# Patient Record
Sex: Male | Born: 1960 | Hispanic: Yes | Marital: Single | Smoking: Current some day smoker
Health system: Southern US, Community
[De-identification: ages and names within clinical notes are randomized; demographics above are authoritative.]

## PROBLEM LIST (undated history)

## (undated) DIAGNOSIS — Z789 Other specified health status: Secondary | ICD-10-CM

## (undated) HISTORY — PX: NO PAST SURGERIES: SHX2092

---

## 2008-11-09 ENCOUNTER — Inpatient Hospital Stay: Payer: Self-pay | Admitting: Internal Medicine

## 2012-11-13 ENCOUNTER — Inpatient Hospital Stay: Payer: Self-pay | Admitting: Internal Medicine

## 2012-11-13 LAB — COMPREHENSIVE METABOLIC PANEL
Alkaline Phosphatase: 82 U/L (ref 50–136)
Anion Gap: 4 — ABNORMAL LOW (ref 7–16)
BUN: 14 mg/dL (ref 7–18)
Bilirubin,Total: 0.3 mg/dL (ref 0.2–1.0)
Chloride: 107 mmol/L (ref 98–107)
Creatinine: 0.8 mg/dL (ref 0.60–1.30)
EGFR (Non-African Amer.): >60
SGOT(AST): 103 U/L — ABNORMAL HIGH (ref 15–37)
SGPT (ALT): 77 U/L (ref 12–78)
Sodium: 140 mmol/L (ref 136–145)

## 2012-11-13 LAB — TROPONIN I: Troponin-I: <0.02 ng/mL

## 2012-11-13 LAB — CBC
HCT: 42.2 % (ref 40.0–52.0)
HGB: 14.3 g/dL (ref 13.0–18.0)
MCH: 29.8 pg (ref 26.0–34.0)
MCHC: 33.8 g/dL (ref 32.0–36.0)
MCV: 88 fL (ref 80–100)
RBC: 4.78 10*6/uL (ref 4.40–5.90)
RDW: 13.5 % (ref 11.5–14.5)
WBC: 14.5 10*3/uL — ABNORMAL HIGH (ref 3.8–10.6)

## 2012-11-14 LAB — CBC WITH DIFFERENTIAL/PLATELET
Basophil %: 0.3 %
Eosinophil #: 0.1 10*3/uL (ref 0.0–0.7)
HGB: 14.2 g/dL (ref 13.0–18.0)
Lymphocyte #: 2 10*3/uL (ref 1.0–3.6)
Lymphocyte %: 21.7 %
MCH: 30.6 pg (ref 26.0–34.0)
MCHC: 34.9 g/dL (ref 32.0–36.0)
Monocyte #: 0.6 x10 3/mm (ref 0.2–1.0)
Monocyte %: 6.5 %
Neutrophil #: 6.4 10*3/uL (ref 1.4–6.5)
Platelet: 209 10*3/uL (ref 150–440)
RBC: 4.64 10*6/uL (ref 4.40–5.90)

## 2012-11-14 LAB — COMPREHENSIVE METABOLIC PANEL
Alkaline Phosphatase: 85 U/L (ref 50–136)
Anion Gap: 6 — ABNORMAL LOW (ref 7–16)
Bilirubin,Total: 0.7 mg/dL (ref 0.2–1.0)
Co2: 25 mmol/L (ref 21–32)
Creatinine: 0.61 mg/dL (ref 0.60–1.30)
EGFR (African American): >60
EGFR (Non-African Amer.): >60
Glucose: 92 mg/dL (ref 65–99)
Osmolality: 277 (ref 275–301)
Potassium: 3.9 mmol/L (ref 3.5–5.1)
SGOT(AST): 40 U/L — ABNORMAL HIGH (ref 15–37)
SGPT (ALT): 69 U/L (ref 12–78)
Sodium: 139 mmol/L (ref 136–145)
Total Protein: 6.7 g/dL (ref 6.4–8.2)

## 2013-06-16 ENCOUNTER — Inpatient Hospital Stay (HOSPITAL_COMMUNITY)
Admission: EM | Admit: 2013-06-16 | Discharge: 2013-06-21 | DRG: 417 | Disposition: A | Discharge status: Home / Self Care | Payer: Self-pay | Attending: Internal Medicine | Admitting: Internal Medicine

## 2013-06-16 ENCOUNTER — Emergency Department (HOSPITAL_COMMUNITY): Payer: Self-pay

## 2013-06-16 ENCOUNTER — Encounter (HOSPITAL_COMMUNITY): Payer: Self-pay | Admitting: Emergency Medicine

## 2013-06-16 DIAGNOSIS — IMO0001 Reserved for inherently not codable concepts without codable children: Secondary | ICD-10-CM | POA: Clinically undetermined

## 2013-06-16 DIAGNOSIS — K859 Acute pancreatitis without necrosis or infection, unspecified: Secondary | ICD-10-CM

## 2013-06-16 DIAGNOSIS — R1013 Epigastric pain: Secondary | ICD-10-CM

## 2013-06-16 DIAGNOSIS — Z9049 Acquired absence of other specified parts of digestive tract: Secondary | ICD-10-CM

## 2013-06-16 DIAGNOSIS — I10 Essential (primary) hypertension: Secondary | ICD-10-CM | POA: Diagnosis present

## 2013-06-16 DIAGNOSIS — K851 Biliary acute pancreatitis without necrosis or infection: Secondary | ICD-10-CM | POA: Diagnosis present

## 2013-06-16 DIAGNOSIS — R112 Nausea with vomiting, unspecified: Secondary | ICD-10-CM | POA: Diagnosis present

## 2013-06-16 DIAGNOSIS — R609 Edema, unspecified: Secondary | ICD-10-CM | POA: Diagnosis present

## 2013-06-16 DIAGNOSIS — K658 Other peritonitis: Secondary | ICD-10-CM | POA: Diagnosis present

## 2013-06-16 DIAGNOSIS — R7989 Other specified abnormal findings of blood chemistry: Secondary | ICD-10-CM | POA: Diagnosis present

## 2013-06-16 DIAGNOSIS — K8020 Calculus of gallbladder without cholecystitis without obstruction: Secondary | ICD-10-CM | POA: Diagnosis present

## 2013-06-16 DIAGNOSIS — K8070 Calculus of gallbladder and bile duct without cholecystitis without obstruction: Secondary | ICD-10-CM | POA: Diagnosis present

## 2013-06-16 HISTORY — DX: Other specified health status: Z78.9

## 2013-06-16 LAB — LIPASE, BLOOD: Lipase: >3000 U/L — ABNORMAL HIGH (ref 11–59)

## 2013-06-16 LAB — COMPREHENSIVE METABOLIC PANEL
ALT: 55 U/L — ABNORMAL HIGH (ref 0–53)
Alkaline Phosphatase: 95 U/L (ref 39–117)
BUN: 17 mg/dL (ref 6–23)
Calcium: 9.9 mg/dL (ref 8.4–10.5)
Chloride: 102 mEq/L (ref 96–112)
GFR calc Af Amer: >90 mL/min (ref >90)
Glucose, Bld: 109 mg/dL — ABNORMAL HIGH (ref 70–99)
Potassium: 4 mEq/L (ref 3.5–5.1)
Sodium: 141 mEq/L (ref 135–145)
Total Bilirubin: 0.5 mg/dL (ref 0.3–1.2)
Total Protein: 8.6 g/dL — ABNORMAL HIGH (ref 6.0–8.3)

## 2013-06-16 LAB — CBC
HCT: 45.4 % (ref 39.0–52.0)
Hemoglobin: 16 g/dL (ref 13.0–17.0)
MCH: 30.8 pg (ref 26.0–34.0)
MCHC: 35.2 g/dL (ref 30.0–36.0)
RBC: 5.2 MIL/uL (ref 4.22–5.81)
WBC: 16.6 10*3/uL — ABNORMAL HIGH (ref 4.0–10.5)

## 2013-06-16 MED ORDER — PROMETHAZINE HCL 25 MG/ML IJ SOLN
12.5000 mg | Freq: Once | INTRAMUSCULAR | Status: AC
  Administered 2013-06-16: 12.5 mg via INTRAVENOUS
  Filled 2013-06-16: qty 1

## 2013-06-16 MED ORDER — ONDANSETRON HCL 4 MG/2ML IJ SOLN
4.0000 mg | Freq: Three times a day (TID) | INTRAMUSCULAR | Status: DC | PRN
  Administered 2013-06-16: 4 mg via INTRAVENOUS
  Filled 2013-06-16: qty 2

## 2013-06-16 MED ORDER — SODIUM CHLORIDE 0.9 % IV BOLUS (SEPSIS)
1000.0000 mL | Freq: Once | INTRAVENOUS | Status: AC
  Administered 2013-06-16: 1000 mL via INTRAVENOUS

## 2013-06-16 MED ORDER — ENOXAPARIN SODIUM 40 MG/0.4ML ~~LOC~~ SOLN
40.0000 mg | SUBCUTANEOUS | Status: DC
  Administered 2013-06-16 – 2013-06-18 (×3): 40 mg via SUBCUTANEOUS
  Filled 2013-06-16 (×5): qty 0.4

## 2013-06-16 MED ORDER — SODIUM CHLORIDE 0.9 % IJ SOLN
3.0000 mL | Freq: Two times a day (BID) | INTRAMUSCULAR | Status: DC
  Administered 2013-06-16 – 2013-06-19 (×4): 3 mL via INTRAVENOUS

## 2013-06-16 MED ORDER — HYDROMORPHONE HCL PF 1 MG/ML IJ SOLN
1.0000 mg | Freq: Once | INTRAMUSCULAR | Status: AC
  Administered 2013-06-16: 1 mg via INTRAVENOUS
  Filled 2013-06-16: qty 1

## 2013-06-16 MED ORDER — ONDANSETRON HCL 4 MG PO TABS: 4.0000 mg | ORAL_TABLET | Freq: Four times a day (QID) | ORAL | Status: DC | PRN

## 2013-06-16 MED ORDER — SODIUM CHLORIDE 0.9 % IV SOLN
INTRAVENOUS | Status: AC
  Administered 2013-06-16: 21:00:00 via INTRAVENOUS

## 2013-06-16 MED ORDER — HYDROMORPHONE HCL PF 1 MG/ML IJ SOLN
1.0000 mg | INTRAMUSCULAR | Status: DC | PRN
  Administered 2013-06-16 – 2013-06-19 (×3): 1 mg via INTRAVENOUS
  Filled 2013-06-16 (×3): qty 1

## 2013-06-16 MED ORDER — PROMETHAZINE HCL 25 MG/ML IJ SOLN
12.5000 mg | Freq: Once | INTRAMUSCULAR | Status: AC
  Administered 2013-06-16: 12.5 mg via INTRAVENOUS
  Filled 2013-06-16 (×2): qty 1

## 2013-06-16 MED ORDER — HYDROMORPHONE HCL PF 1 MG/ML IJ SOLN: 1.0000 mg | INTRAMUSCULAR | Status: DC | PRN

## 2013-06-16 MED ORDER — ONDANSETRON HCL 4 MG/2ML IJ SOLN
4.0000 mg | Freq: Four times a day (QID) | INTRAMUSCULAR | Status: DC | PRN
  Administered 2013-06-17: 4 mg via INTRAVENOUS
  Filled 2013-06-16: qty 2

## 2013-06-16 MED ORDER — SODIUM CHLORIDE 0.9 % IV SOLN
INTRAVENOUS | Status: AC
  Administered 2013-06-16 – 2013-06-17 (×2): via INTRAVENOUS

## 2013-06-16 NOTE — ED Provider Notes (Signed)
CSN: [id number]     Arrival date & time 06/16/13  1530 History   First MD Initiated Contact with Patient 06/16/13 1540     Chief Complaint  Patient presents with  . Back Pain  . Abdominal Pain   (Consider location/radiation/quality/duration/timing/severity/associated sxs/prior Treatment) HPI Comments: The patient is a 52 year-old male with a past medical history of pancreatitis, presenting the Emergency Department with a chief complaint of epigastric pain for 1 hour. The patient reports 10/10 discomfort described as cramping and radiating to his upper back.  He reports an abrupt onset of abdominal pain while at work.  He denies aggravating factors or relieving factors.  Denies EtOH use. Denies history of abdominal surgeries.     The history is provided by the patient. A language interpreter was used.    History reviewed. No pertinent past medical history. History reviewed. No pertinent past surgical history. History reviewed. No pertinent family history. History  Substance Use Topics  . Smoking status: Never Smoker   . Smokeless tobacco: Not on file  . Alcohol Use: No    Review of Systems  Constitutional: Negative for fever and chills.  Respiratory: Negative for cough and shortness of breath.   Cardiovascular: Negative for chest pain and leg swelling.  Gastrointestinal: Positive for abdominal pain. Negative for nausea, vomiting, diarrhea, constipation and blood in stool.  Genitourinary: Negative for dysuria, hematuria and flank pain.  Musculoskeletal: Positive for back pain.  All other systems reviewed and are negative.    Allergies  Review of patient's allergies indicates no known allergies.  Home Medications  No current outpatient prescriptions on file. BP 189/107  Pulse 67  Temp(Src) 97.8 F (36.6 C) (Oral)  Resp 20  SpO2 97% Physical Exam  Nursing note and vitals reviewed. Constitutional: He is oriented to person, place, and time. He appears well-developed and  well-nourished.  Appears uncomfortable  HENT:  Head: Normocephalic and atraumatic.  Eyes: EOM are normal. Pupils are equal, round, and reactive to light. No scleral icterus.  Neck: Neck supple.  Cardiovascular: Normal rate, regular rhythm and normal heart sounds.   No murmur heard. Pulmonary/Chest: Effort normal and breath sounds normal. He has no wheezes.  Abdominal: Soft. Normal appearance and bowel sounds are normal. There is tenderness in the right upper quadrant, epigastric area and left upper quadrant. There is no rebound, no guarding, no CVA tenderness, no tenderness at McBurney's point and negative Murphy's sign.  Musculoskeletal: Normal range of motion. He exhibits no edema.  Neurological: He is alert and oriented to person, place, and time.  Skin: Skin is warm and dry. No rash noted.  Psychiatric: He has a normal mood and affect.    ED Course  Procedures (including critical care time) Labs Review Labs Reviewed  CBC - Abnormal; Notable for the following:    WBC 16.6 (*)    All other components within normal limits  COMPREHENSIVE METABOLIC PANEL - Abnormal; Notable for the following:    Glucose, Bld 109 (*)    Total Protein 8.6 (*)    AST 85 (*)    ALT 55 (*)    All other components within normal limits  LIPASE, BLOOD - Abnormal; Notable for the following:    Lipase >3000 (*)    All other components within normal limits   Imaging Review US Abdomen Complete  06/16/2013   CLINICAL DATA:  Abdomen pain  EXAM: ULTRASOUND ABDOMEN COMPLETE  COMPARISON:  None.  FINDINGS: Gallbladder:  There are sludge and stones within  the gallbladder. No sonographic Murphy sign noted.  Common bile duct:  Diameter: 5.7 mm  Liver:  There is diffuse increased echotexture the visualized right lobe liver. The left lobe liver is not visualized due to overlying bowel gas and patient body habitus.  IVC:  Not well seen due to overlying bowel gas.  Pancreas:  Not well seen due to overlying bowel gas.   Spleen:  Size and appearance within normal limits.  Right Kidney:  Length: 11.8 cm. Echogenicity within normal limits. No mass or hydronephrosis visualized.  Left Kidney:  Length: 11.7 cm. Echogenicity within normal limits. No mass or hydronephrosis visualized.  Abdominal aorta:  No aneurysm visualized in the proximal and mid aorta. The distal aorta is not well seen due to overlying bowel gas.  Other findings:  None.  IMPRESSION: Diffuse increased echotexture of the liver with low echogenicity around the gallbladder fossa probably due to fatty infiltration of liver with focal fatty sparing. Midline structures as described are not well seen due to overlying bowel gas. Cholelithiasis without sonographic evidence of acute cholecystitis.   Electronically Signed   By: Sherian Rein M.D.   On: 06/16/2013 18:26   Dg Abd Acute W/chest  06/16/2013   CLINICAL DATA:  Epigastric pain with nausea and vomiting.  EXAM: ACUTE ABDOMEN SERIES (ABDOMEN 2 VIEW & CHEST 1 VIEW)  COMPARISON:  None.  FINDINGS: Frontal view of the chest demonstrates midline trachea. Heart size upper normal, accentuated by low lung volumes. No pleural effusion or pneumothorax. Bibasilar volume loss with patchy airspace disease.  Abdominal films demonstrate no free intraperitoneal air or significant air-fluid levels on upright positioning. Minimal motion degradation on upright view.  Supine view demonstrates mild paucity of upper abdominal bowel gas. Distal gas and stool. No abnormal abdominal calcifications. No appendicolith. Minimal convex right lumbar spine curvature.  IMPRESSION: Nonspecific bowel gas pattern, with paucity of upper abdominal small bowel gas. No obstruction or free intraperitoneal air identified.  Bibasilar volume loss with patchy atelectasis.   Electronically Signed   By: Jeronimo Greaves M.D.   On: 06/16/2013 17:20    EKG Interpretation   None       MDM   1. Pancreatitis    Patient with a history of pancreatitis presents with  an abrupt onset of epigastric pain.  IV fluids, pain medication, labs ordered. Discussed patient history, condition, and current evaluation with Dr. Blinda Leatherwood who advises [place] and acute abdominal series to rule out perforation. Imaging ordered. 1715 re-eval: patient still in pain and has not received his pain medication yet.  He complains of nausea. Medication ordered. Lipase >3000 Re-eval patient reports pain has improved and nausea has resolved.  Discussed pancreatitis with the patient and consult call with the hospitalist.  1915: Discussed patient history, condition and results with Dr. Tarry Kos who will admit the patient for pancreatitis.  Advises to consult general surgery. 1948: Discussed patient history, condition and results with Dr. Lodema Pilot, General Surgery.  He reports he will evaluate the patient.  Meds given in ED:  Medications  sodium chloride 0.9 % bolus 1,000 mL (not administered)  HYDROmorphone (DILAUDID) injection 1 mg (1 mg Intravenous Given 06/16/13 1646)  sodium chloride 0.9 % bolus 1,000 mL (1,000 mLs Intravenous New Bag/Given 06/16/13 1646)  promethazine (PHENERGAN) injection 12.5 mg (12.5 mg Intravenous Given 06/16/13 1809)  HYDROmorphone (DILAUDID) injection 1 mg (1 mg Intravenous Given 06/16/13 1835)  promethazine (PHENERGAN) injection 12.5 mg (12.5 mg Intravenous Given 06/16/13 1902)    New  Prescriptions   No medications on file      Clabe Seal, PA-C 06/17/13 0110

## 2013-06-16 NOTE — Consult Note (Signed)
Reason for Consult:pancreatitis Referring Physician: Mellody Drown, PA  Brandon Hood is an 52 y.o. male.  HPI: we were asked to evaluate this patient for gallstone pancreatitis. He had the acute onset of pain earlier today which was located in his epigastrium. He denied any lower abdominal pain and states that the pain is rated at 8/10. He has a history of prior admission for pancreatitis about 6 months ago which he says was the same type of pain that he feels currently.  He has not had any nausea vomiting or fevers or chills. He says that he has used the bathroom routinely without any blood in the stools. He denies any postprandial pain previously and besides these 2 episodes. He denies any recent alcohol use.  He has been a drinker in the past but has been several years.  Past Medical History  Diagnosis Date  . Medical history non-contributory     Past Surgical History  Procedure Laterality Date  . No past surgeries      History reviewed. No pertinent family history.  Social History:  reports that he has never smoked. He has never used smokeless tobacco. He reports that he does not drink alcohol or use illicit drugs.  Allergies: No Known Allergies  Medications: I have reviewed the patient's current medications.  Results for orders placed during the hospital encounter of 06/16/13 (from the past 48 hour(s))  CBC     Status: Abnormal   Collection Time    06/16/13  4:40 PM      Result Value Range   WBC 16.6 (*) 4.0 - 10.5 K/uL   RBC 5.20  4.22 - 5.81 MIL/uL   Hemoglobin 16.0  13.0 - 17.0 g/dL   HCT 82.9  56.2 - 13.0 %   MCV 87.3  78.0 - 100.0 fL   MCH 30.8  26.0 - 34.0 pg   MCHC 35.2  30.0 - 36.0 g/dL   RDW 86.5  78.4 - 69.6 %   Platelets 281  150 - 400 K/uL  COMPREHENSIVE METABOLIC PANEL     Status: Abnormal   Collection Time    06/16/13  4:40 PM      Result Value Range   Sodium 141  135 - 145 mEq/L   Potassium 4.0  3.5 - 5.1 mEq/L   Chloride 102  96 - 112 mEq/L    CO2 27  19 - 32 mEq/L   Glucose, Bld 109 (*) 70 - 99 mg/dL   BUN 17  6 - 23 mg/dL   Creatinine, Ser 2.95  0.50 - 1.35 mg/dL   Calcium 9.9  8.4 - 28.4 mg/dL   Total Protein 8.6 (*) 6.0 - 8.3 g/dL   Albumin 4.6  3.5 - 5.2 g/dL   AST 85 (*) 0 - 37 U/L   ALT 55 (*) 0 - 53 U/L   Alkaline Phosphatase 95  39 - 117 U/L   Total Bilirubin 0.5  0.3 - 1.2 mg/dL   GFR calc non Af Amer >90  >90 mL/min   GFR calc Af Amer >90  >90 mL/min   Comment: (NOTE)     The eGFR has been calculated using the CKD EPI equation.     This calculation has not been validated in all clinical situations.     eGFR's persistently <90 mL/min signify possible Chronic Kidney     Disease.  LIPASE, BLOOD     Status: Abnormal   Collection Time    06/16/13  4:40 PM  Result Value Range   Lipase >3000 (*) 11 - 59 U/L    US Abdomen Complete  06/16/2013   CLINICAL DATA:  Abdomen pain  EXAM: ULTRASOUND ABDOMEN COMPLETE  COMPARISON:  None.  FINDINGS: Gallbladder:  There are sludge and stones within the gallbladder. No sonographic Murphy sign noted.  Common bile duct:  Diameter: 5.7 mm  Liver:  There is diffuse increased echotexture the visualized right lobe liver. The left lobe liver is not visualized due to overlying bowel gas and patient body habitus.  IVC:  Not well seen due to overlying bowel gas.  Pancreas:  Not well seen due to overlying bowel gas.  Spleen:  Size and appearance within normal limits.  Right Kidney:  Length: 11.8 cm. Echogenicity within normal limits. No mass or hydronephrosis visualized.  Left Kidney:  Length: 11.7 cm. Echogenicity within normal limits. No mass or hydronephrosis visualized.  Abdominal aorta:  No aneurysm visualized in the proximal and mid aorta. The distal aorta is not well seen due to overlying bowel gas.  Other findings:  None.  IMPRESSION: Diffuse increased echotexture of the liver with low echogenicity around the gallbladder fossa probably due to fatty infiltration of liver with focal fatty  sparing. Midline structures as described are not well seen due to overlying bowel gas. Cholelithiasis without sonographic evidence of acute cholecystitis.   Electronically Signed   By: Sherian Rein M.D.   On: 06/16/2013 18:26   Dg Abd Acute W/chest  06/16/2013   CLINICAL DATA:  Epigastric pain with nausea and vomiting.  EXAM: ACUTE ABDOMEN SERIES (ABDOMEN 2 VIEW & CHEST 1 VIEW)  COMPARISON:  None.  FINDINGS: Frontal view of the chest demonstrates midline trachea. Heart size upper normal, accentuated by low lung volumes. No pleural effusion or pneumothorax. Bibasilar volume loss with patchy airspace disease.  Abdominal films demonstrate no free intraperitoneal air or significant air-fluid levels on upright positioning. Minimal motion degradation on upright view.  Supine view demonstrates mild paucity of upper abdominal bowel gas. Distal gas and stool. No abnormal abdominal calcifications. No appendicolith. Minimal convex right lumbar spine curvature.  IMPRESSION: Nonspecific bowel gas pattern, with paucity of upper abdominal small bowel gas. No obstruction or free intraperitoneal air identified.  Bibasilar volume loss with patchy atelectasis.   Electronically Signed   By: Jeronimo Greaves M.D.   On: 06/16/2013 17:20    ROS All other review of systems negative or noncontributory except as stated in the HPI  Blood pressure 189/107, pulse 67, temperature 97.8 F (36.6 C), temperature source Oral, resp. rate 20, SpO2 97.00%. General appearance: alert, cooperative and no distress Head: Normocephalic, without obvious abnormality, atraumatic Neck: no JVD and supple, symmetrical, trachea midline Resp: clear to auscultation bilaterally Cardio: normal rate, regular rhythm GI: soft, upper abdominal tenderness, greatest in epigastrium, no peritonitis Extremities: extremities normal, atraumatic, no cyanosis or edema Pulses: 2+ and symmetric Skin: Skin color, texture, turgor normal. No rashes or  lesions Neurologic: Grossly normal  Assessment/Plan: Abdominal pain I agree that this is most likely due to pancreatitis. He denies any recent alcohol use although he does have a history of alcohol use in the past. He does have gallstones on ultrasound and would likely benefit from cholecystectomy when his current peritonitis resolves. I agree with admission and bowel rest. I would recommend aggressive IV hydration with n.p.o. Status. If his abdominal pain is now significantly improved tomorrow, I would recommend CT abdomen to evaluate for the severity of his pancreatitis. Currently he has no evidence  of necrotic pancreas or infected pancreatitis. Surgery will follow along and consider cholecystectomy after his symptoms improved.  Lodema Pilot DAVID 06/16/2013, 8:01 PM

## 2013-06-16 NOTE — ED Notes (Signed)
Pt c/o upper back almost at neck pain and ABD pain that started today. He speaks mainly spanish and only some broken english.

## 2013-06-16 NOTE — H&P (Signed)
Chief Complaint:  abd pain , n/v  HPI: 52 yo male healthy comes in with sudden onset of epigastric and ruq abd pain at 3pm with over 4 episodes of n/v nonbloody.  No fever.  No diarrhea.  Has his gallbladder still, and had an episode of pancreatitis about 6 months ago at outside facility.  Feels much better with ivf, zofran and dilaudid in Ed.  Denies any past medical history.  Denies etoh use.  Denies any new medications.  Review of Systems:  Positive and negative as per HPI otherwise all other systems are negative  Past Medical History: Past Medical History  Diagnosis Date  . Medical history non-contributory    Past Surgical History  Procedure Laterality Date  . No past surgeries      Medications: Prior to Admission medications   Not on File  none  Allergies:  No Known Allergies  Social History:  reports that he has never smoked. He has never used smokeless tobacco. He reports that he does not drink alcohol or use illicit drugs.  Family History: History reviewed. No pertinent family history.  Physical Exam: Filed Vitals:   06/16/13 1542 06/16/13 2002 06/16/13 2027  BP: 189/107 165/90 165/93  Pulse: 67 59 52  Temp: 97.8 F (36.6 C)  97.6 F (36.4 C)  TempSrc: Oral  Oral  Resp: 20  18  Weight:   89.1 kg (196 lb 6.9 oz)  SpO2: 97% 100% 98%   General appearance: alert, cooperative and no distress Head: Normocephalic, without obvious abnormality, atraumatic Eyes: negative Nose: Nares normal. Septum midline. Mucosa normal. No drainage or sinus tenderness. Neck: no JVD and supple, symmetrical, trachea midline Lungs: clear to auscultation bilaterally Heart: regular rate and rhythm, S1, S2 normal, no murmur, click, rub or gallop Abdomen: soft, non-tender; bowel sounds normal; no masses,  no organomegaly  Mild ttp epi area Extremities: extremities normal, atraumatic, no cyanosis or edema Pulses: 2+ and symmetric Skin: Skin color, texture, turgor normal. No rashes or  lesions Neurologic: Grossly normal    Labs on Admission:   Recent Labs  06/16/13 1640  NA 141  K 4.0  CL 102  CO2 27  GLUCOSE 109*  BUN 17  CREATININE 0.80  CALCIUM 9.9    Recent Labs  06/16/13 1640  AST 85*  ALT 55*  ALKPHOS 95  BILITOT 0.5  PROT 8.6*  ALBUMIN 4.6    Recent Labs  06/16/13 1640  LIPASE >3000*    Recent Labs  06/16/13 1640  WBC 16.6*  HGB 16.0  HCT 45.4  MCV 87.3  PLT 281   Radiological Exams on Admission: US Abdomen Complete  06/16/2013   CLINICAL DATA:  Abdomen pain  EXAM: ULTRASOUND ABDOMEN COMPLETE  COMPARISON:  None.  FINDINGS: Gallbladder:  There are sludge and stones within the gallbladder. No sonographic Murphy sign noted.  Common bile duct:  Diameter: 5.7 mm  Liver:  There is diffuse increased echotexture the visualized right lobe liver. The left lobe liver is not visualized due to overlying bowel gas and patient body habitus.  IVC:  Not well seen due to overlying bowel gas.  Pancreas:  Not well seen due to overlying bowel gas.  Spleen:  Size and appearance within normal limits.  Right Kidney:  Length: 11.8 cm. Echogenicity within normal limits. No mass or hydronephrosis visualized.  Left Kidney:  Length: 11.7 cm. Echogenicity within normal limits. No mass or hydronephrosis visualized.  Abdominal aorta:  No aneurysm visualized in the proximal and mid  aorta. The distal aorta is not well seen due to overlying bowel gas.  Other findings:  None.  IMPRESSION: Diffuse increased echotexture of the liver with low echogenicity around the gallbladder fossa probably due to fatty infiltration of liver with focal fatty sparing. Midline structures as described are not well seen due to overlying bowel gas. Cholelithiasis without sonographic evidence of acute cholecystitis.   Electronically Signed   By: Sherian Rein M.D.   On: 06/16/2013 18:26   Dg Abd Acute W/chest  06/16/2013   CLINICAL DATA:  Epigastric pain with nausea and vomiting.  EXAM: ACUTE  ABDOMEN SERIES (ABDOMEN 2 VIEW & CHEST 1 VIEW)  COMPARISON:  None.  FINDINGS: Frontal view of the chest demonstrates midline trachea. Heart size upper normal, accentuated by low lung volumes. No pleural effusion or pneumothorax. Bibasilar volume loss with patchy airspace disease.  Abdominal films demonstrate no free intraperitoneal air or significant air-fluid levels on upright positioning. Minimal motion degradation on upright view.  Supine view demonstrates mild paucity of upper abdominal bowel gas. Distal gas and stool. No abnormal abdominal calcifications. No appendicolith. Minimal convex right lumbar spine curvature.  IMPRESSION: Nonspecific bowel gas pattern, with paucity of upper abdominal small bowel gas. No obstruction or free intraperitoneal air identified.  Bibasilar volume loss with patchy atelectasis.   Electronically Signed   By: Jeronimo Greaves M.D.   On: 06/16/2013 17:20    Assessment/Plan  52 yo male with probable acute gallstone pancreatitis recurrent  Principal Problem:   Pancreatitis, acute-  Npo.  Ivf.  Iv zofran and dilaudid prn.  gen surgery consulted for possible chole once pancreatitis improved.    Active Problems:   Abdominal pain, epigastric   Nausea with vomiting   High blood pressure- may be related to his pain, monitor, no prior h/o htn.   Acute pancreatitis   Cholelithiasis   Elevated LFTs  Full code.  Primarily spanish speaking, prefers spanish speaking doctor if possible.  Advised flow manage of his request which we should be able to accommodate.  Concha Sudol A 06/16/2013, 9:35 PM

## 2013-06-17 ENCOUNTER — Other Ambulatory Visit: Payer: Self-pay

## 2013-06-17 ENCOUNTER — Inpatient Hospital Stay (HOSPITAL_COMMUNITY): Payer: Self-pay

## 2013-06-17 DIAGNOSIS — R7989 Other specified abnormal findings of blood chemistry: Secondary | ICD-10-CM

## 2013-06-17 LAB — COMPREHENSIVE METABOLIC PANEL
AST: 42 U/L — ABNORMAL HIGH (ref 0–37)
BUN: 13 mg/dL (ref 6–23)
CO2: 23 mEq/L (ref 19–32)
Calcium: 8.6 mg/dL (ref 8.4–10.5)
Chloride: 104 mEq/L (ref 96–112)
Creatinine, Ser: 0.77 mg/dL (ref 0.50–1.35)
GFR calc Af Amer: >90 mL/min (ref >90)
GFR calc non Af Amer: >90 mL/min (ref >90)
Glucose, Bld: 132 mg/dL — ABNORMAL HIGH (ref 70–99)
Potassium: 3.9 mEq/L (ref 3.5–5.1)
Total Bilirubin: 0.7 mg/dL (ref 0.3–1.2)

## 2013-06-17 LAB — PROTIME-INR
INR: 1.04 (ref 0.00–1.49)
Prothrombin Time: 13.4 seconds (ref 11.6–15.2)

## 2013-06-17 LAB — CBC
HCT: 42.3 % (ref 39.0–52.0)
MCH: 30.1 pg (ref 26.0–34.0)
Platelets: 223 10*3/uL (ref 150–400)
RBC: 4.79 MIL/uL (ref 4.22–5.81)

## 2013-06-17 LAB — LIPASE, BLOOD: Lipase: 1794 U/L — ABNORMAL HIGH (ref 11–59)

## 2013-06-17 MED ORDER — KCL IN DEXTROSE-NACL 20-5-0.45 MEQ/L-%-% IV SOLN
INTRAVENOUS | Status: DC
  Administered 2013-06-17 – 2013-06-20 (×8): via INTRAVENOUS
  Filled 2013-06-17 (×11): qty 1000

## 2013-06-17 MED ORDER — IOHEXOL 300 MG/ML  SOLN
100.0000 mL | Freq: Once | INTRAMUSCULAR | Status: AC | PRN
  Administered 2013-06-17: 100 mL via INTRAVENOUS

## 2013-06-17 MED ORDER — IOHEXOL 300 MG/ML  SOLN
25.0000 mL | INTRAMUSCULAR | Status: AC
  Administered 2013-06-17 (×2): 25 mL via ORAL

## 2013-06-17 NOTE — Progress Notes (Signed)
Subjective: Still having mid epigastric and left sided pain going to his back. He has had this one other time.  He does not drink.  Objective: Vital signs in last 24 hours: Temp:  [97.6 F (36.4 C)-97.8 F (36.6 C)] 97.6 F (36.4 C) (12/02 2027) Pulse Rate:  [52-67] 52 (12/02 2027) Resp:  [18-20] 18 (12/02 2027) BP: (165-189)/(90-107) 165/93 mmHg (12/02 2027) SpO2:  [97 %-100 %] 98 % (12/02 2027) Weight:  [89.1 kg (196 lb 6.9 oz)] 89.1 kg (196 lb 6.9 oz) (12/02 2027) Last BM Date: 06/16/13 Afebrile, hypertensive, last PM reported to be 133/61 this AM Lipase better still 1627, down from greater than 3000 Intake/Output from previous day: 12/02 0701 - 12/03 0700 In: 1207.5 [I.V.:1207.5] Out: -  Intake/Output this shift:    General appearance: alert, cooperative and no distress GI: soft, tender midepigastric and left side, more than right.  +BS, no distension.  Lab Results:   Recent Labs  06/16/13 1640 06/17/13 0532  WBC 16.6* 11.6*  HGB 16.0 14.4  HCT 45.4 42.3  PLT 281 223    BMET  Recent Labs  06/16/13 1640 06/17/13 0532  NA 141 138  K 4.0 3.9  CL 102 104  CO2 27 23  GLUCOSE 109* 132*  BUN 17 13  CREATININE 0.80 0.77  CALCIUM 9.9 8.6   PT/INR  Recent Labs  06/17/13 0532  LABPROT 13.4  INR 1.04     Recent Labs Lab 06/16/13 1640 06/17/13 0532  AST 85* 42*  ALT 55* 49  ALKPHOS 95 80  BILITOT 0.5 0.7  PROT 8.6* 6.9  ALBUMIN 4.6 3.6     Lipase     Component Value Date/Time   LIPASE 1627* 06/17/2013 0532     Studies/Results: US Abdomen Complete  06/16/2013   CLINICAL DATA:  Abdomen pain  EXAM: ULTRASOUND ABDOMEN COMPLETE  COMPARISON:  None.  FINDINGS: Gallbladder:  There are sludge and stones within the gallbladder. No sonographic Murphy sign noted.  Common bile duct:  Diameter: 5.7 mm  Liver:  There is diffuse increased echotexture the visualized right lobe liver. The left lobe liver is not visualized due to overlying bowel gas and  patient body habitus.  IVC:  Not well seen due to overlying bowel gas.  Pancreas:  Not well seen due to overlying bowel gas.  Spleen:  Size and appearance within normal limits.  Right Kidney:  Length: 11.8 cm. Echogenicity within normal limits. No mass or hydronephrosis visualized.  Left Kidney:  Length: 11.7 cm. Echogenicity within normal limits. No mass or hydronephrosis visualized.  Abdominal aorta:  No aneurysm visualized in the proximal and mid aorta. The distal aorta is not well seen due to overlying bowel gas.  Other findings:  None.  IMPRESSION: Diffuse increased echotexture of the liver with low echogenicity around the gallbladder fossa probably due to fatty infiltration of liver with focal fatty sparing. Midline structures as described are not well seen due to overlying bowel gas. Cholelithiasis without sonographic evidence of acute cholecystitis.   Electronically Signed   By: Sherian Rein M.D.   On: 06/16/2013 18:26   Dg Abd Acute W/chest  06/16/2013   CLINICAL DATA:  Epigastric pain with nausea and vomiting.  EXAM: ACUTE ABDOMEN SERIES (ABDOMEN 2 VIEW & CHEST 1 VIEW)  COMPARISON:  None.  FINDINGS: Frontal view of the chest demonstrates midline trachea. Heart size upper normal, accentuated by low lung volumes. No pleural effusion or pneumothorax. Bibasilar volume loss with patchy airspace disease.  Abdominal films demonstrate no free intraperitoneal air or significant air-fluid levels on upright positioning. Minimal motion degradation on upright view.  Supine view demonstrates mild paucity of upper abdominal bowel gas. Distal gas and stool. No abnormal abdominal calcifications. No appendicolith. Minimal convex right lumbar spine curvature.  IMPRESSION: Nonspecific bowel gas pattern, with paucity of upper abdominal small bowel gas. No obstruction or free intraperitoneal air identified.  Bibasilar volume loss with patchy atelectasis.   Electronically Signed   By: Jeronimo Greaves M.D.   On: 06/16/2013 17:20     Medications: . enoxaparin (LOVENOX) injection  40 mg Subcutaneous Q24H  . sodium chloride  3 mL Intravenous Q12H    Assessment/Plan Gallstone pancreatitis   Plan:  Follow with you and plan cholecystectomy after his pancreatitis is better.  labs ordered for tomorrow AM.  LOS: 1 day    Cionna Collantes 06/17/2013

## 2013-06-17 NOTE — Progress Notes (Signed)
CARE MANAGEMENT NOTE 06/17/2013  Patient:  Brandon Hood, Brandon Hood   Account Number:  [id number]  Date Initiated:  06/17/2013  Documentation initiated by:  Elester Apodaca  Subjective/Objective Assessment:   pt with abd pain elevated lipase over 3000/nausea nd vomiting cholelitasis by ct scan     Action/Plan:   will follow for needs   Anticipated DC Date:  06/20/2013   Anticipated DC Plan:  HOME/SELF CARE  In-house referral  Financial Counselor      DC Planning Services  NA      [facility] Choice  NA   Choice offered to / List presented to:  NA   DME arranged  NA      DME agency  NA     HH arranged  NA      HH agency  NA   Status of service:  In process, will continue to follow Medicare Important Message given?  NA - LOS <3 / Initial given by admissions (If response is "NO", the following Medicare IM given date fields will be blank) Date Medicare IM given:   Date Additional Medicare IM given:    Discharge Disposition:    Per UR Regulation:  Reviewed for med. necessity/level of care/duration of stay  If discussed at Long Length of Stay Meetings, dates discussed:    Comments:  [id number]/Shermaine Brigham Stark Jock, BSN, [place] [phone] Chart Reviewed for discharge and hospital needs. Discharge needs at time of review:  None present will follow for needs. Review of patient progress due on [id number].

## 2013-06-17 NOTE — Progress Notes (Signed)
[facility] PROGRESS NOTE  Brandon Hood ZOX:[id number] DOB: 10-26-1960 DOA: 06/16/2013 PCP: No primary provider on file.  Assessment/Plan: Principal Problem:   Pancreatitis, acute - Continue bowel rest - Will place on maintenance IV fluids - Lipase trending down from 3000. But has gone up since last check we'll continue n.p.o. status and continue bowel rest. -Reassess lipase levels next a.m. and consider advancing diet - Most likely secondary to gallstones. Discuss with patient and he is considering cholecystectomy. General surgery on board I would like to wait for inflammation to go down prior to cholecystectomy.  Active Problems:   Abdominal pain, epigastric - Secondary to acute pancreatitis    Nausea with vomiting - Secondary to primary problem and currently improving - Continue anti-emetics   High blood pressure - We'll continue to monitor him last blood pressure 133/61   Cholelithiasis - As mentioned above Gen. surgery on board and plans will be for cholecystectomy if patient chooses after resolution of pancreatitis    Elevated LFTs - Currently trending down  Code Status: Full Family Communication: Patient in room by himself. I have updated the patient in Spanish and he is aware of the current medical plan will defer to him if he would like to have family updated Disposition Plan: Will be to advance diet as tolerated with improvement of abdominal discomfort and amelioration of pancreatitis   Consultants:  Gen. Surgery: Dr. Biagio Quint  Procedures:  None  Antibiotics:  None  HPI/Subjective: No new problems reported. Patient states that he feels better today but still having some epigastric abdominal discomfort.  Objective: Filed Vitals:   06/17/13 0715  BP: 133/61  Pulse: 68  Temp: 97.3 F (36.3 C)  Resp:     Intake/Output Summary (Last 24 hours) at 06/17/13 1159 Last data filed at 06/17/13 0700  Gross per 24 hour  Intake 1207.5 ml  Output       0 ml  Net 1207.5 ml   Filed Weights   06/16/13 2027  Weight: 89.1 kg (196 lb 6.9 oz)    Exam:   General:  Pt in NAD, Alert and awake  Cardiovascular: RRR, no MRG  Respiratory: CTA BL, no wheezes  Abdomen: epigastric discomfort, ND, soft  Musculoskeletal: no cyanosis or clubbing   Data Reviewed: Basic Metabolic Panel:  Recent Labs Lab 06/16/13 1640 06/17/13 0532  NA 141 138  K 4.0 3.9  CL 102 104  CO2 27 23  GLUCOSE 109* 132*  BUN 17 13  CREATININE 0.80 0.77  CALCIUM 9.9 8.6   Liver Function Tests:  Recent Labs Lab 06/16/13 1640 06/17/13 0532  AST 85* 42*  ALT 55* 49  ALKPHOS 95 80  BILITOT 0.5 0.7  PROT 8.6* 6.9  ALBUMIN 4.6 3.6    Recent Labs Lab 06/16/13 1640 06/17/13 0532 06/17/13 0930  LIPASE >3000* 1627* 1794*   No results found for this basename: AMMONIA,  in the last 168 hours CBC:  Recent Labs Lab 06/16/13 1640 06/17/13 0532  WBC 16.6* 11.6*  HGB 16.0 14.4  HCT 45.4 42.3  MCV 87.3 88.3  PLT 281 223   Cardiac Enzymes: No results found for this basename: CKTOTAL, CKMB, CKMBINDEX, TROPONINI,  in the last 168 hours BNP (last 3 results) No results found for this basename: PROBNP,  in the last 8760 hours CBG: No results found for this basename: GLUCAP,  in the last 168 hours  No results found for this or any previous visit (from the past 240 hour(s)).   Studies: US  Abdomen Complete  06/16/2013   CLINICAL DATA:  Abdomen pain  EXAM: ULTRASOUND ABDOMEN COMPLETE  COMPARISON:  None.  FINDINGS: Gallbladder:  There are sludge and stones within the gallbladder. No sonographic Murphy sign noted.  Common bile duct:  Diameter: 5.7 mm  Liver:  There is diffuse increased echotexture the visualized right lobe liver. The left lobe liver is not visualized due to overlying bowel gas and patient body habitus.  IVC:  Not well seen due to overlying bowel gas.  Pancreas:  Not well seen due to overlying bowel gas.  Spleen:  Size and appearance within  normal limits.  Right Kidney:  Length: 11.8 cm. Echogenicity within normal limits. No mass or hydronephrosis visualized.  Left Kidney:  Length: 11.7 cm. Echogenicity within normal limits. No mass or hydronephrosis visualized.  Abdominal aorta:  No aneurysm visualized in the proximal and mid aorta. The distal aorta is not well seen due to overlying bowel gas.  Other findings:  None.  IMPRESSION: Diffuse increased echotexture of the liver with low echogenicity around the gallbladder fossa probably due to fatty infiltration of liver with focal fatty sparing. Midline structures as described are not well seen due to overlying bowel gas. Cholelithiasis without sonographic evidence of acute cholecystitis.   Electronically Signed   By: Sherian Rein M.D.   On: 06/16/2013 18:26   Dg Abd Acute W/chest  06/16/2013   CLINICAL DATA:  Epigastric pain with nausea and vomiting.  EXAM: ACUTE ABDOMEN SERIES (ABDOMEN 2 VIEW & CHEST 1 VIEW)  COMPARISON:  None.  FINDINGS: Frontal view of the chest demonstrates midline trachea. Heart size upper normal, accentuated by low lung volumes. No pleural effusion or pneumothorax. Bibasilar volume loss with patchy airspace disease.  Abdominal films demonstrate no free intraperitoneal air or significant air-fluid levels on upright positioning. Minimal motion degradation on upright view.  Supine view demonstrates mild paucity of upper abdominal bowel gas. Distal gas and stool. No abnormal abdominal calcifications. No appendicolith. Minimal convex right lumbar spine curvature.  IMPRESSION: Nonspecific bowel gas pattern, with paucity of upper abdominal small bowel gas. No obstruction or free intraperitoneal air identified.  Bibasilar volume loss with patchy atelectasis.   Electronically Signed   By: Jeronimo Greaves M.D.   On: 06/16/2013 17:20    Scheduled Meds: . enoxaparin (LOVENOX) injection  40 mg Subcutaneous Q24H  . sodium chloride  3 mL Intravenous Q12H   Continuous Infusions:    Principal Problem:   Pancreatitis, acute Active Problems:   Abdominal pain, epigastric   Nausea with vomiting   High blood pressure   Acute pancreatitis   Cholelithiasis   Elevated LFTs    Time spent: > 35 minutes    Penny Pia  [facility] Pager [phone] If 7PM-7AM, please contact night-coverage at [email or web address], password [facility] 06/17/2013, 11:59 AM  LOS: 1 day

## 2013-06-18 LAB — COMPREHENSIVE METABOLIC PANEL
ALT: 30 U/L (ref 0–53)
Albumin: 3.3 g/dL — ABNORMAL LOW (ref 3.5–5.2)
Alkaline Phosphatase: 72 U/L (ref 39–117)
CO2: 25 mEq/L (ref 19–32)
Calcium: 8.4 mg/dL (ref 8.4–10.5)
Creatinine, Ser: 0.66 mg/dL (ref 0.50–1.35)
GFR calc Af Amer: >90 mL/min (ref >90)
GFR calc non Af Amer: >90 mL/min (ref >90)
Glucose, Bld: 112 mg/dL — ABNORMAL HIGH (ref 70–99)
Sodium: 135 mEq/L (ref 135–145)
Total Protein: 6.8 g/dL (ref 6.0–8.3)

## 2013-06-18 LAB — LIPASE, BLOOD: Lipase: 701 U/L — ABNORMAL HIGH (ref 11–59)

## 2013-06-18 LAB — CBC
HCT: 40.2 % (ref 39.0–52.0)
Hemoglobin: 14 g/dL (ref 13.0–17.0)
MCHC: 34.8 g/dL (ref 30.0–36.0)
MCV: 87.6 fL (ref 78.0–100.0)
Platelets: 222 10*3/uL (ref 150–400)
RBC: 4.59 MIL/uL (ref 4.22–5.81)

## 2013-06-18 LAB — APTT: aPTT: 33 seconds (ref 24–37)

## 2013-06-18 LAB — PROTIME-INR
INR: 1.11 (ref 0.00–1.49)
Prothrombin Time: 14.1 seconds (ref 11.6–15.2)

## 2013-06-18 MED ORDER — SODIUM CHLORIDE 0.9 % IV BOLUS (SEPSIS)
1000.0000 mL | Freq: Once | INTRAVENOUS | Status: AC
  Administered 2013-06-18: 1000 mL via INTRAVENOUS

## 2013-06-18 NOTE — Progress Notes (Signed)
Subjective: He is feeling much better, no pain this afternoon.  Objective: Vital signs in last 24 hours: Temp:  [98.1 F (36.7 C)-99.9 F (37.7 C)] 98.5 F (36.9 C) (12/04 1344) Pulse Rate:  [69-72] 69 (12/04 1344) Resp:  [18] 18 (12/04 1344) BP: (125-141)/(73-93) 125/73 mmHg (12/04 1344) SpO2:  [97 %-98 %] 97 % (12/04 1344) Last BM Date: 06/16/13 Afebrile, VSS Lipase down to 701 WBC still 11.2 Intake/Output from previous day: 12/03 0701 - 12/04 0700 In: 1515 [I.V.:1515] Out: -  Intake/Output this shift:    General appearance: alert, cooperative and no distress Resp: clear to auscultation bilaterally GI: soft, non-tender; bowel sounds normal; no masses,  no organomegaly  Lab Results:   Recent Labs  06/17/13 0532 06/18/13 0537  WBC 11.6* 11.2*  HGB 14.4 14.0  HCT 42.3 40.2  PLT 223 222    BMET  Recent Labs  06/17/13 0532 06/18/13 0537  NA 138 135  K 3.9 3.8  CL 104 101  CO2 23 25  GLUCOSE 132* 112*  BUN 13 9  CREATININE 0.77 0.66  CALCIUM 8.6 8.4   PT/INR  Recent Labs  06/17/13 0532 06/18/13 0537  LABPROT 13.4 14.1  INR 1.04 1.11     Recent Labs Lab 06/16/13 1640 06/17/13 0532 06/18/13 0537  AST 85* 42* 24  ALT 55* 49 30  ALKPHOS 95 80 72  BILITOT 0.5 0.7 0.7  PROT 8.6* 6.9 6.8  ALBUMIN 4.6 3.6 3.3*     Lipase     Component Value Date/Time   LIPASE 701* 06/18/2013 0537     Studies/Results: US Abdomen Complete  06/16/2013   CLINICAL DATA:  Abdomen pain  EXAM: ULTRASOUND ABDOMEN COMPLETE  COMPARISON:  None.  FINDINGS: Gallbladder:  There are sludge and stones within the gallbladder. No sonographic Murphy sign noted.  Common bile duct:  Diameter: 5.7 mm  Liver:  There is diffuse increased echotexture the visualized right lobe liver. The left lobe liver is not visualized due to overlying bowel gas and patient body habitus.  IVC:  Not well seen due to overlying bowel gas.  Pancreas:  Not well seen due to overlying bowel gas.  Spleen:   Size and appearance within normal limits.  Right Kidney:  Length: 11.8 cm. Echogenicity within normal limits. No mass or hydronephrosis visualized.  Left Kidney:  Length: 11.7 cm. Echogenicity within normal limits. No mass or hydronephrosis visualized.  Abdominal aorta:  No aneurysm visualized in the proximal and mid aorta. The distal aorta is not well seen due to overlying bowel gas.  Other findings:  None.  IMPRESSION: Diffuse increased echotexture of the liver with low echogenicity around the gallbladder fossa probably due to fatty infiltration of liver with focal fatty sparing. Midline structures as described are not well seen due to overlying bowel gas. Cholelithiasis without sonographic evidence of acute cholecystitis.   Electronically Signed   By: Sherian Rein M.D.   On: 06/16/2013 18:26   Ct Abdomen Pelvis W Contrast  06/17/2013   CLINICAL DATA:  Pancreatitis, mid abdominal pain  EXAM: CT ABDOMEN AND PELVIS WITH CONTRAST  TECHNIQUE: Multidetector CT imaging of the abdomen and pelvis was performed using the standard protocol following bolus administration of intravenous contrast. Sagittal and coronal MPR images reconstructed from axial data set.  CONTRAST:  OMNIPAQUE IOHEXOL 300 MG/ML SOLN. Dilute oral contrast.  COMPARISON:  None  FINDINGS: Dependent atelectasis at both lung bases.  Enlarged edematous pancreas with peripancreatic infiltrative changes consistent with acute pancreatitis.  Fluid identified along Gerota's fascia to the lateral conal fascia bilaterally greater on left.  No evidence of pancreatic pseudocyst, hemorrhage or necrosis.  No ductal dilatation or calcifications seen.  Vascular structures appear patent.  Cyst within lateral segment left lobe liver 2.8 x 3.2 cm image 18.  Liver, spleen, kidneys, and adrenal glands normal appearance.  Normal appendix.  Low-attenuation free pelvic fluid.  Stomach and bowel loops normal appearance.  Unremarkable bladder in ureters.  No mass,  adenopathy, free air, or acute osseous findings.  IMPRESSION: Acute uncomplicated pancreatitis.  Nonspecific free pelvic fluid.  Hepatic cyst.   Electronically Signed   By: Ulyses Southward M.D.   On: 06/17/2013 17:53   Dg Abd Acute W/chest  06/16/2013   CLINICAL DATA:  Epigastric pain with nausea and vomiting.  EXAM: ACUTE ABDOMEN SERIES (ABDOMEN 2 VIEW & CHEST 1 VIEW)  COMPARISON:  None.  FINDINGS: Frontal view of the chest demonstrates midline trachea. Heart size upper normal, accentuated by low lung volumes. No pleural effusion or pneumothorax. Bibasilar volume loss with patchy airspace disease.  Abdominal films demonstrate no free intraperitoneal air or significant air-fluid levels on upright positioning. Minimal motion degradation on upright view.  Supine view demonstrates mild paucity of upper abdominal bowel gas. Distal gas and stool. No abnormal abdominal calcifications. No appendicolith. Minimal convex right lumbar spine curvature.  IMPRESSION: Nonspecific bowel gas pattern, with paucity of upper abdominal small bowel gas. No obstruction or free intraperitoneal air identified.  Bibasilar volume loss with patchy atelectasis.   Electronically Signed   By: Jeronimo Greaves M.D.   On: 06/16/2013 17:20    Medications: . enoxaparin (LOVENOX) injection  40 mg Subcutaneous Q24H  . sodium chloride  3 mL Intravenous Q12H    Assessment/Plan Gallstone pancreatitis   Plan:  Recheck labs in AM and plan surgery tomorrow.   LOS: 2 days    Brandon Hood 06/18/2013

## 2013-06-18 NOTE — Progress Notes (Signed)
[facility] PROGRESS NOTE  Brandon Hood ZOX:[id number] DOB: May 30, 1961 DOA: 06/16/2013 PCP: No primary provider on file.  Assessment/Plan: Principal Problem:   Pancreatitis, acute Likely secondary to gallstone pancreatitis. Improving. - Continue bowel rest - Decrease IV fluids 250 cc per hour. Given 1 L bolus.  - Lipase trending down from 3000. Continue bowel rest. IV fluids. Follow. -Reassess lipase levels next a.m. and consider advancing diet if continued improvement. - Most likely secondary to gallstones. Dr. Cena Benton discussed with patient and he is considering cholecystectomy. General surgery on board I would like to wait for inflammation to go down prior to cholecystectomy.  Active Problems:   Abdominal pain, epigastric - Secondary to acute pancreatitis. Clinical improvement.    Nausea with vomiting - Secondary to primary problem and improving - Continue anti-emetics, supportive care.   Elevated blood pressure - Likely secondary to pain. Improving. Current blood pressure is 125/73. Follow.   Cholelithiasis - As mentioned above Gen. surgery on board and plans will be for cholecystectomy if patient chooses after resolution of pancreatitis    Elevated LFTs - Currently trending down  Code Status: Full Family Communication: Patient in room by himself. I have updated the patient and he is aware of the current medical plan will defer to him if he would like to have family updated Disposition Plan: Home when medically stable.   Consultants:  Gen. Surgery: Dr. Biagio Quint  Procedures:  None  Antibiotics:  None  HPI/Subjective: Patient states epigastric pain improving. No nausea. No vomiting.  Objective: Filed Vitals:   06/18/13 1344  BP: 125/73  Pulse: 69  Temp: 98.5 F (36.9 C)  Resp: 18    Intake/Output Summary (Last 24 hours) at 06/18/13 1654 Last data filed at 06/18/13 1400  Gross per 24 hour  Intake   2715 ml  Output      0 ml  Net   2715 ml    Filed Weights   06/16/13 2027  Weight: 89.1 kg (196 lb 6.9 oz)    Exam:   General:  Pt in NAD, Alert and awake  Cardiovascular: RRR, no MRG  Respiratory: CTA BL, no wheezes  Abdomen: epigastric discomfort, ND, soft  Musculoskeletal: no cyanosis or clubbing   Data Reviewed: Basic Metabolic Panel:  Recent Labs Lab 06/16/13 1640 06/17/13 0532 06/18/13 0537  NA 141 138 135  K 4.0 3.9 3.8  CL 102 104 101  CO2 27 23 25   GLUCOSE 109* 132* 112*  BUN 17 13 9   CREATININE 0.80 0.77 0.66  CALCIUM 9.9 8.6 8.4   Liver Function Tests:  Recent Labs Lab 06/16/13 1640 06/17/13 0532 06/18/13 0537  AST 85* 42* 24  ALT 55* 49 30  ALKPHOS 95 80 72  BILITOT 0.5 0.7 0.7  PROT 8.6* 6.9 6.8  ALBUMIN 4.6 3.6 3.3*    Recent Labs Lab 06/16/13 1640 06/17/13 0532 06/17/13 0930 06/18/13 0537  LIPASE >3000* 1627* 1794* 701*   No results found for this basename: AMMONIA,  in the last 168 hours CBC:  Recent Labs Lab 06/16/13 1640 06/17/13 0532 06/18/13 0537  WBC 16.6* 11.6* 11.2*  HGB 16.0 14.4 14.0  HCT 45.4 42.3 40.2  MCV 87.3 88.3 87.6  PLT 281 223 222   Cardiac Enzymes: No results found for this basename: CKTOTAL, CKMB, CKMBINDEX, TROPONINI,  in the last 168 hours BNP (last 3 results) No results found for this basename: PROBNP,  in the last 8760 hours CBG: No results found for this basename: GLUCAP,  in the  last 168 hours  No results found for this or any previous visit (from the past 240 hour(s)).   Studies: US Abdomen Complete  06/16/2013   CLINICAL DATA:  Abdomen pain  EXAM: ULTRASOUND ABDOMEN COMPLETE  COMPARISON:  None.  FINDINGS: Gallbladder:  There are sludge and stones within the gallbladder. No sonographic Murphy sign noted.  Common bile duct:  Diameter: 5.7 mm  Liver:  There is diffuse increased echotexture the visualized right lobe liver. The left lobe liver is not visualized due to overlying bowel gas and patient body habitus.  IVC:  Not well seen  due to overlying bowel gas.  Pancreas:  Not well seen due to overlying bowel gas.  Spleen:  Size and appearance within normal limits.  Right Kidney:  Length: 11.8 cm. Echogenicity within normal limits. No mass or hydronephrosis visualized.  Left Kidney:  Length: 11.7 cm. Echogenicity within normal limits. No mass or hydronephrosis visualized.  Abdominal aorta:  No aneurysm visualized in the proximal and mid aorta. The distal aorta is not well seen due to overlying bowel gas.  Other findings:  None.  IMPRESSION: Diffuse increased echotexture of the liver with low echogenicity around the gallbladder fossa probably due to fatty infiltration of liver with focal fatty sparing. Midline structures as described are not well seen due to overlying bowel gas. Cholelithiasis without sonographic evidence of acute cholecystitis.   Electronically Signed   By: Sherian Rein M.D.   On: 06/16/2013 18:26   Ct Abdomen Pelvis W Contrast  06/17/2013   CLINICAL DATA:  Pancreatitis, mid abdominal pain  EXAM: CT ABDOMEN AND PELVIS WITH CONTRAST  TECHNIQUE: Multidetector CT imaging of the abdomen and pelvis was performed using the standard protocol following bolus administration of intravenous contrast. Sagittal and coronal MPR images reconstructed from axial data set.  CONTRAST:  OMNIPAQUE IOHEXOL 300 MG/ML SOLN. Dilute oral contrast.  COMPARISON:  None  FINDINGS: Dependent atelectasis at both lung bases.  Enlarged edematous pancreas with peripancreatic infiltrative changes consistent with acute pancreatitis.  Fluid identified along Gerota's fascia to the lateral conal fascia bilaterally greater on left.  No evidence of pancreatic pseudocyst, hemorrhage or necrosis.  No ductal dilatation or calcifications seen.  Vascular structures appear patent.  Cyst within lateral segment left lobe liver 2.8 x 3.2 cm image 18.  Liver, spleen, kidneys, and adrenal glands normal appearance.  Normal appendix.  Low-attenuation free pelvic fluid.   Stomach and bowel loops normal appearance.  Unremarkable bladder in ureters.  No mass, adenopathy, free air, or acute osseous findings.  IMPRESSION: Acute uncomplicated pancreatitis.  Nonspecific free pelvic fluid.  Hepatic cyst.   Electronically Signed   By: Ulyses Southward M.D.   On: 06/17/2013 17:53   Dg Abd Acute W/chest  06/16/2013   CLINICAL DATA:  Epigastric pain with nausea and vomiting.  EXAM: ACUTE ABDOMEN SERIES (ABDOMEN 2 VIEW & CHEST 1 VIEW)  COMPARISON:  None.  FINDINGS: Frontal view of the chest demonstrates midline trachea. Heart size upper normal, accentuated by low lung volumes. No pleural effusion or pneumothorax. Bibasilar volume loss with patchy airspace disease.  Abdominal films demonstrate no free intraperitoneal air or significant air-fluid levels on upright positioning. Minimal motion degradation on upright view.  Supine view demonstrates mild paucity of upper abdominal bowel gas. Distal gas and stool. No abnormal abdominal calcifications. No appendicolith. Minimal convex right lumbar spine curvature.  IMPRESSION: Nonspecific bowel gas pattern, with paucity of upper abdominal small bowel gas. No obstruction or free intraperitoneal air identified.  Bibasilar volume loss with patchy atelectasis.   Electronically Signed   By: Jeronimo Greaves M.D.   On: 06/16/2013 17:20    Scheduled Meds: . enoxaparin (LOVENOX) injection  40 mg Subcutaneous Q24H  . sodium chloride  3 mL Intravenous Q12H   Continuous Infusions: . dextrose 5 % and 0.45 % NaCl with KCl 20 mEq/L 150 mL/hr at 06/18/13 1610    Principal Problem:   Pancreatitis, acute Active Problems:   Abdominal pain, epigastric   Nausea with vomiting   High blood pressure   Acute pancreatitis   Cholelithiasis   Elevated LFTs   Elevated blood pressure    Time spent: > 35 minutes    Deja Pisarski M.D. [facility] Pager [phone] If 7PM-7AM, please contact night-coverage at [email or web address], password [facility] 06/18/2013, 4:54  PM  LOS: 2 days

## 2013-06-19 ENCOUNTER — Encounter (HOSPITAL_COMMUNITY)
Admission: EM | Disposition: A | Discharge status: Home / Self Care | Payer: Self-pay | Source: Home / Self Care | Attending: Internal Medicine

## 2013-06-19 ENCOUNTER — Inpatient Hospital Stay (HOSPITAL_COMMUNITY): Payer: Self-pay

## 2013-06-19 ENCOUNTER — Encounter (HOSPITAL_COMMUNITY): Payer: MEDICAID | Admitting: Anesthesiology

## 2013-06-19 ENCOUNTER — Inpatient Hospital Stay (HOSPITAL_COMMUNITY): Payer: Self-pay | Admitting: Anesthesiology

## 2013-06-19 DIAGNOSIS — K8010 Calculus of gallbladder with chronic cholecystitis without obstruction: Secondary | ICD-10-CM

## 2013-06-19 DIAGNOSIS — R030 Elevated blood-pressure reading, without diagnosis of hypertension: Secondary | ICD-10-CM

## 2013-06-19 HISTORY — PX: CHOLECYSTECTOMY: SHX55

## 2013-06-19 LAB — COMPREHENSIVE METABOLIC PANEL
ALT: 21 U/L (ref 0–53)
AST: 15 U/L (ref 0–37)
Albumin: 3 g/dL — ABNORMAL LOW (ref 3.5–5.2)
CO2: 26 mEq/L (ref 19–32)
Calcium: 8.7 mg/dL (ref 8.4–10.5)
Creatinine, Ser: 0.73 mg/dL (ref 0.50–1.35)
GFR calc Af Amer: >90 mL/min (ref >90)
GFR calc non Af Amer: >90 mL/min (ref >90)
Sodium: 136 mEq/L (ref 135–145)
Total Protein: 6.5 g/dL (ref 6.0–8.3)

## 2013-06-19 LAB — SURGICAL PCR SCREEN
MRSA, PCR: NEGATIVE
Staphylococcus aureus: NEGATIVE

## 2013-06-19 LAB — LIPASE, BLOOD: Lipase: 170 U/L — ABNORMAL HIGH (ref 11–59)

## 2013-06-19 LAB — CBC
MCHC: 34.2 g/dL (ref 30.0–36.0)
MCV: 88 fL (ref 78.0–100.0)
Platelets: 215 10*3/uL (ref 150–400)
RBC: 4.32 MIL/uL (ref 4.22–5.81)
WBC: 10.9 10*3/uL — ABNORMAL HIGH (ref 4.0–10.5)

## 2013-06-19 SURGERY — LAPAROSCOPIC CHOLECYSTECTOMY WITH INTRAOPERATIVE CHOLANGIOGRAM
Anesthesia: General | Site: Abdomen

## 2013-06-19 MED ORDER — HYDROMORPHONE HCL PF 1 MG/ML IJ SOLN
0.2500 mg | INTRAMUSCULAR | Status: DC | PRN
  Administered 2013-06-19: 0.5 mg via INTRAVENOUS

## 2013-06-19 MED ORDER — DEXAMETHASONE SODIUM PHOSPHATE 10 MG/ML IJ SOLN
INTRAMUSCULAR | Status: AC
  Filled 2013-06-19: qty 1

## 2013-06-19 MED ORDER — LACTATED RINGERS IV SOLN
INTRAVENOUS | Status: DC
  Administered 2013-06-19: 1000 mL via INTRAVENOUS
  Administered 2013-06-19: 17:00:00 via INTRAVENOUS

## 2013-06-19 MED ORDER — GLYCOPYRROLATE 0.2 MG/ML IJ SOLN
INTRAMUSCULAR | Status: DC | PRN
  Administered 2013-06-19: 0.6 mg via INTRAVENOUS

## 2013-06-19 MED ORDER — SODIUM CHLORIDE 0.9 % IR SOLN
Status: DC | PRN
  Administered 2013-06-19: 1000 mL

## 2013-06-19 MED ORDER — BUPIVACAINE HCL (PF) 0.5 % IJ SOLN
INTRAMUSCULAR | Status: AC
  Filled 2013-06-19: qty 30

## 2013-06-19 MED ORDER — PROPOFOL 10 MG/ML IV BOLUS
INTRAVENOUS | Status: AC
  Filled 2013-06-19: qty 20

## 2013-06-19 MED ORDER — CEFAZOLIN SODIUM-DEXTROSE 2-3 GM-% IV SOLR
INTRAVENOUS | Status: AC
  Filled 2013-06-19: qty 50

## 2013-06-19 MED ORDER — NEOSTIGMINE METHYLSULFATE 1 MG/ML IJ SOLN
INTRAMUSCULAR | Status: DC | PRN
  Administered 2013-06-19: 4 mg via INTRAVENOUS

## 2013-06-19 MED ORDER — ROCURONIUM BROMIDE 100 MG/10ML IV SOLN
INTRAVENOUS | Status: DC | PRN
  Administered 2013-06-19: 10 mg via INTRAVENOUS
  Administered 2013-06-19: 25 mg via INTRAVENOUS
  Administered 2013-06-19: 5 mg via INTRAVENOUS

## 2013-06-19 MED ORDER — LACTATED RINGERS IV SOLN: INTRAVENOUS | Status: DC

## 2013-06-19 MED ORDER — SUCCINYLCHOLINE CHLORIDE 20 MG/ML IJ SOLN
INTRAMUSCULAR | Status: DC | PRN
  Administered 2013-06-19: 100 mg via INTRAVENOUS

## 2013-06-19 MED ORDER — ONDANSETRON HCL 4 MG/2ML IJ SOLN
INTRAMUSCULAR | Status: DC | PRN
  Administered 2013-06-19: 4 mg via INTRAVENOUS

## 2013-06-19 MED ORDER — GLUCAGON HCL (RDNA) 1 MG IJ SOLR
INTRAMUSCULAR | Status: AC
  Filled 2013-06-19: qty 1

## 2013-06-19 MED ORDER — HYDROMORPHONE HCL PF 1 MG/ML IJ SOLN
INTRAMUSCULAR | Status: AC
  Filled 2013-06-19: qty 1

## 2013-06-19 MED ORDER — ONDANSETRON HCL 4 MG/2ML IJ SOLN
INTRAMUSCULAR | Status: AC
  Filled 2013-06-19: qty 2

## 2013-06-19 MED ORDER — LIDOCAINE HCL (CARDIAC) 20 MG/ML IV SOLN
INTRAVENOUS | Status: DC | PRN
  Administered 2013-06-19: 100 mg via INTRAVENOUS

## 2013-06-19 MED ORDER — FENTANYL CITRATE 0.05 MG/ML IJ SOLN
INTRAMUSCULAR | Status: DC | PRN
  Administered 2013-06-19 (×2): 100 ug via INTRAVENOUS
  Administered 2013-06-19: 50 ug via INTRAVENOUS

## 2013-06-19 MED ORDER — CEFAZOLIN SODIUM-DEXTROSE 2-3 GM-% IV SOLR
2.0000 g | Freq: Once | INTRAVENOUS | Status: AC
  Administered 2013-06-19: 2 g via INTRAVENOUS

## 2013-06-19 MED ORDER — ACETAMINOPHEN 325 MG PO TABS: 650.0000 mg | ORAL_TABLET | ORAL | Status: DC | PRN

## 2013-06-19 MED ORDER — MIDAZOLAM HCL 5 MG/5ML IJ SOLN
INTRAMUSCULAR | Status: DC | PRN
  Administered 2013-06-19: 2 mg via INTRAVENOUS

## 2013-06-19 MED ORDER — MIDAZOLAM HCL 2 MG/2ML IJ SOLN
INTRAMUSCULAR | Status: AC
  Filled 2013-06-19: qty 2

## 2013-06-19 MED ORDER — ROCURONIUM BROMIDE 100 MG/10ML IV SOLN
INTRAVENOUS | Status: AC
  Filled 2013-06-19: qty 1

## 2013-06-19 MED ORDER — LACTATED RINGERS IR SOLN
Status: DC | PRN
  Administered 2013-06-19: 1000 mL

## 2013-06-19 MED ORDER — NEOSTIGMINE METHYLSULFATE 1 MG/ML IJ SOLN
INTRAMUSCULAR | Status: AC
  Filled 2013-06-19: qty 10

## 2013-06-19 MED ORDER — FENTANYL CITRATE 0.05 MG/ML IJ SOLN
INTRAMUSCULAR | Status: AC
  Filled 2013-06-19: qty 5

## 2013-06-19 MED ORDER — LIDOCAINE HCL (CARDIAC) 20 MG/ML IV SOLN
INTRAVENOUS | Status: AC
  Filled 2013-06-19: qty 5

## 2013-06-19 MED ORDER — GLYCOPYRROLATE 0.2 MG/ML IJ SOLN
INTRAMUSCULAR | Status: AC
  Filled 2013-06-19: qty 3

## 2013-06-19 MED ORDER — BUPIVACAINE-EPINEPHRINE PF 0.25-1:200000 % IJ SOLN
INTRAMUSCULAR | Status: AC
  Filled 2013-06-19: qty 30

## 2013-06-19 MED ORDER — BUPIVACAINE-EPINEPHRINE 0.25% -1:200000 IJ SOLN
INTRAMUSCULAR | Status: DC | PRN
  Administered 2013-06-19: 10 mL

## 2013-06-19 MED ORDER — PROPOFOL 10 MG/ML IV BOLUS
INTRAVENOUS | Status: DC | PRN
  Administered 2013-06-19: 180 mg via INTRAVENOUS

## 2013-06-19 MED ORDER — DEXAMETHASONE SODIUM PHOSPHATE 10 MG/ML IJ SOLN
INTRAMUSCULAR | Status: DC | PRN
  Administered 2013-06-19: 10 mg via INTRAVENOUS

## 2013-06-19 MED ORDER — IOHEXOL 300 MG/ML  SOLN
INTRAMUSCULAR | Status: DC | PRN
  Administered 2013-06-19: 10 mL

## 2013-06-19 MED ORDER — SUCCINYLCHOLINE CHLORIDE 20 MG/ML IJ SOLN
INTRAMUSCULAR | Status: AC
  Filled 2013-06-19: qty 1

## 2013-06-19 SURGICAL SUPPLY — 40 items
APPLIER CLIP ROT 10 11.4 M/L (STAPLE) ×2
BENZOIN TINCTURE PRP APPL 2/3 (GAUZE/BANDAGES/DRESSINGS) ×2 IMPLANT
CABLE HIGH FREQUENCY MONO STRZ (ELECTRODE) IMPLANT
CANISTER SUCTION 2500CC (MISCELLANEOUS) ×2 IMPLANT
CATH REDDICK CHOLANGI 4FR 50CM (CATHETERS) ×2 IMPLANT
CLIP APPLIE ROT 10 11.4 M/L (STAPLE) ×1 IMPLANT
COVER MAYO STAND STRL (DRAPES) ×2 IMPLANT
DECANTER SPIKE VIAL GLASS SM (MISCELLANEOUS) IMPLANT
DRAPE C-ARM 42X120 X-RAY (DRAPES) ×2 IMPLANT
DRAPE LAPAROSCOPIC ABDOMINAL (DRAPES) ×2 IMPLANT
ELECT REM PT RETURN 9FT ADLT (ELECTROSURGICAL) ×2
ELECTRODE REM PT RTRN 9FT ADLT (ELECTROSURGICAL) ×1 IMPLANT
GAUZE SPONGE 2X2 8PLY STRL LF (GAUZE/BANDAGES/DRESSINGS) ×1 IMPLANT
GLOVE BIOGEL M 8.0 STRL (GLOVE) ×2 IMPLANT
GLOVE BIOGEL PI IND STRL 7.5 (GLOVE) ×2 IMPLANT
GLOVE BIOGEL PI INDICATOR 7.5 (GLOVE) ×2
GLOVE SURG SS PI 7.5 STRL IVOR (GLOVE) ×4 IMPLANT
GOWN STRL REIN 2XL XLG LVL4 (GOWN DISPOSABLE) ×2 IMPLANT
GOWN STRL REIN XL XLG (GOWN DISPOSABLE) ×4 IMPLANT
HEMOSTAT SURGICEL 4X8 (HEMOSTASIS) IMPLANT
IV CATH 14GX2 1/4 (CATHETERS) ×2 IMPLANT
KIT BASIN OR (CUSTOM PROCEDURE TRAY) ×2 IMPLANT
NS IRRIG 1000ML POUR BTL (IV SOLUTION) ×2 IMPLANT
POUCH SPECIMEN RETRIEVAL 10MM (ENDOMECHANICALS) ×2 IMPLANT
SCISSORS LAP 5X35 DISP (ENDOMECHANICALS) ×2 IMPLANT
SET IRRIG TUBING LAPAROSCOPIC (IRRIGATION / IRRIGATOR) ×2 IMPLANT
SLEEVE XCEL OPT CAN 5 100 (ENDOMECHANICALS) ×4 IMPLANT
SOLUTION ANTI FOG 6CC (MISCELLANEOUS) ×2 IMPLANT
SPONGE GAUZE 2X2 STER 10/PKG (GAUZE/BANDAGES/DRESSINGS) ×1
STRIP CLOSURE SKIN 1/2X4 (GAUZE/BANDAGES/DRESSINGS) ×2 IMPLANT
SUT VIC AB 4-0 SH 18 (SUTURE) ×2 IMPLANT
SYR 20CC LL (SYRINGE) ×2 IMPLANT
TAPE CLOTH SURG 4X10 WHT LF (GAUZE/BANDAGES/DRESSINGS) ×2 IMPLANT
TOWEL OR 17X26 10 PK STRL BLUE (TOWEL DISPOSABLE) ×2 IMPLANT
TOWEL OR NON WOVEN STRL DISP B (DISPOSABLE) ×2 IMPLANT
TRAY LAP CHOLE (CUSTOM PROCEDURE TRAY) ×2 IMPLANT
TROCAR BLADELESS OPT 5 100 (ENDOMECHANICALS) ×2 IMPLANT
TROCAR XCEL BLUNT TIP 100MML (ENDOMECHANICALS) ×2 IMPLANT
TROCAR XCEL NON-BLD 11X100MML (ENDOMECHANICALS) ×2 IMPLANT
TUBING INSUFFLATION 10FT LAP (TUBING) ×2 IMPLANT

## 2013-06-19 NOTE — Progress Notes (Signed)
[facility] PROGRESS NOTE  Brandon Hood UJW:[id number] DOB: January 30, 1961 DOA: 06/16/2013 PCP: No primary provider on file.  Assessment/Plan: Principal Problem:   Pancreatitis, acute Likely secondary to gallstone pancreatitis. Improving. Patient denies any abdominal pain. No nausea. No emesis. - Continue bowel rest - Continue IV fluids at 150 cc per hour.  - Lipase trending down from 3000, and currently at 170 today. Continue bowel rest. IV fluids. Follow. Patient for probable cholecystectomy today per general surgery note. Will follow lipase levels in the morning. Postsurgery if continued clinical improvement will start a diet when okay with general surgery.  Active Problems:   Abdominal pain, epigastric - Secondary to acute pancreatitis. Clinical improvement. Patient denies any further abdominal pain. No nausea no vomiting. Lipase trending down.    Nausea with vomiting - Secondary to primary problem and improved - Continue anti-emetics, supportive care.   Elevated blood pressure - Likely secondary to pain. Improved. Current blood pressure is 135/92. Follow.   Cholelithiasis - As mentioned above Gen. surgery on board and plans will be for cholecystectomy today per general surgery note.     Elevated LFTs - Currently trending down  Code Status: Full Family Communication: Updated patient and wife at bedside. Disposition Plan: Home when medically stable.   Consultants:  Gen. Surgery: Dr. Biagio Quint  Procedures:  None  Antibiotics:  None  HPI/Subjective: Patient states epigastric pain improving. No nausea. No vomiting.  Objective: Filed Vitals:   06/19/13 0519  BP: 135/92  Pulse: 69  Temp: 97.8 F (36.6 C)  Resp: 18    Intake/Output Summary (Last 24 hours) at 06/19/13 0837 Last data filed at 06/19/13 5621  Gross per 24 hour  Intake   3685 ml  Output      0 ml  Net   3685 ml   Filed Weights   06/16/13 2027  Weight: 89.1 kg (196 lb 6.9 oz)     Exam:   General:  Pt in NAD, Alert and awake  Cardiovascular: RRR, no MRG  Respiratory: CTA BL, no wheezes  Abdomen: Soft, nontender, nondistended, positive bowel sounds.  Musculoskeletal: no cyanosis or clubbing   Data Reviewed: Basic Metabolic Panel:  Recent Labs Lab 06/16/13 1640 06/17/13 0532 06/18/13 0537 06/19/13 0516  NA 141 138 135 136  K 4.0 3.9 3.8 3.9  CL 102 104 101 102  CO2 27 23 25 26   GLUCOSE 109* 132* 112* 115*  BUN 17 13 9 9   CREATININE 0.80 0.77 0.66 0.73  CALCIUM 9.9 8.6 8.4 8.7   Liver Function Tests:  Recent Labs Lab 06/16/13 1640 06/17/13 0532 06/18/13 0537 06/19/13 0516  AST 85* 42* 24 15  ALT 55* 49 30 21  ALKPHOS 95 80 72 68  BILITOT 0.5 0.7 0.7 0.6  PROT 8.6* 6.9 6.8 6.5  ALBUMIN 4.6 3.6 3.3* 3.0*    Recent Labs Lab 06/16/13 1640 06/17/13 0532 06/17/13 0930 06/18/13 0537 06/19/13 0516  LIPASE >3000* 1627* 1794* 701* 170*   No results found for this basename: AMMONIA,  in the last 168 hours CBC:  Recent Labs Lab 06/16/13 1640 06/17/13 0532 06/18/13 0537 06/19/13 0516  WBC 16.6* 11.6* 11.2* 10.9*  HGB 16.0 14.4 14.0 13.0  HCT 45.4 42.3 40.2 38.0*  MCV 87.3 88.3 87.6 88.0  PLT 281 223 222 215   Cardiac Enzymes: No results found for this basename: CKTOTAL, CKMB, CKMBINDEX, TROPONINI,  in the last 168 hours BNP (last 3 results) No results found for this basename: PROBNP,  in the last  8760 hours CBG: No results found for this basename: GLUCAP,  in the last 168 hours  Recent Results (from the past 240 hour(s))  SURGICAL PCR SCREEN     Status: None   Collection Time    06/19/13  5:24 AM      Result Value Range Status   MRSA, PCR NEGATIVE  NEGATIVE Final   Staphylococcus aureus NEGATIVE  NEGATIVE Final   Comment:            The Xpert SA Assay (FDA     approved for NASAL specimens     in patients over 64 years of age),     is one component of     a comprehensive surveillance     program.  Test performance  has     been validated by [employer] for patients greater     than or equal to 41 year old.     It is not intended     to diagnose infection nor to     guide or monitor treatment.     Studies: Ct Abdomen Pelvis W Contrast  06/17/2013   CLINICAL DATA:  Pancreatitis, mid abdominal pain  EXAM: CT ABDOMEN AND PELVIS WITH CONTRAST  TECHNIQUE: Multidetector CT imaging of the abdomen and pelvis was performed using the standard protocol following bolus administration of intravenous contrast. Sagittal and coronal MPR images reconstructed from axial data set.  CONTRAST:  OMNIPAQUE IOHEXOL 300 MG/ML SOLN. Dilute oral contrast.  COMPARISON:  None  FINDINGS: Dependent atelectasis at both lung bases.  Enlarged edematous pancreas with peripancreatic infiltrative changes consistent with acute pancreatitis.  Fluid identified along Gerota's fascia to the lateral conal fascia bilaterally greater on left.  No evidence of pancreatic pseudocyst, hemorrhage or necrosis.  No ductal dilatation or calcifications seen.  Vascular structures appear patent.  Cyst within lateral segment left lobe liver 2.8 x 3.2 cm image 18.  Liver, spleen, kidneys, and adrenal glands normal appearance.  Normal appendix.  Low-attenuation free pelvic fluid.  Stomach and bowel loops normal appearance.  Unremarkable bladder in ureters.  No mass, adenopathy, free air, or acute osseous findings.  IMPRESSION: Acute uncomplicated pancreatitis.  Nonspecific free pelvic fluid.  Hepatic cyst.   Electronically Signed   By: Ulyses Southward M.D.   On: 06/17/2013 17:53    Scheduled Meds: . enoxaparin (LOVENOX) injection  40 mg Subcutaneous Q24H  . sodium chloride  3 mL Intravenous Q12H   Continuous Infusions: . dextrose 5 % and 0.45 % NaCl with KCl 20 mEq/L 150 mL/hr at 06/19/13 1610    Principal Problem:   Pancreatitis, acute Active Problems:   Abdominal pain, epigastric   Nausea with vomiting   High blood pressure   Acute pancreatitis    Cholelithiasis   Elevated LFTs   Elevated blood pressure    Time spent: > 35 minutes    [name] M.D. [facility] Pager [phone] If 7PM-7AM, please contact night-coverage at [email or web address], password [facility] 06/19/2013, 8:37 AM  LOS: 3 days

## 2013-06-19 NOTE — Transfer of Care (Signed)
Immediate Anesthesia Transfer of Care Note  Patient: Brandon Hood  Procedure(s) Performed: Procedure(s): LAPAROSCOPIC CHOLECYSTECTOMY WITH INTRAOPERATIVE CHOLANGIOGRAM (N/A)  Patient Location: PACU  Anesthesia Type:General  Level of Consciousness: awake, alert , oriented and patient cooperative  Airway & Oxygen Therapy: Patient Spontanous Breathing and Patient connected to face mask oxygen  Post-op Assessment: Report given to PACU RN and Post -op Vital signs reviewed and stable  Post vital signs: Reviewed and stable  Complications: No apparent anesthesia complications

## 2013-06-19 NOTE — H&P (Signed)
Brandon Hood is an 52 y.o. male.  HPI: we were asked to evaluate this patient for gallstone pancreatitis. He had the acute onset of pain earlier today which was located in his epigastrium. He denied any lower abdominal pain and states that the pain is rated at 8/10. He has a history of prior admission for pancreatitis about 6 months ago which he says was the same type of pain that he feels currently. He has not had any nausea vomiting or fevers or chills. He says that he has used the bathroom routinely without any blood in the stools. He denies any postprandial pain previously and besides these 2 episodes. He denies any recent alcohol use. He has been a drinker in the past but has been several years. His pancreatitis has resolved and he is now ready for laparoscopic cholecystectomy.   Past Medical History   Diagnosis  Date   .  Medical history non-contributory     Past Surgical History   Procedure  Laterality  Date   .  No past surgeries     History reviewed. No pertinent family history.  Social History: reports that he has never smoked. He has never used smokeless tobacco. He reports that he does not drink alcohol or use illicit drugs.  Allergies: No Known Allergies  Medications: I have reviewed the patient's current medications.  Results for orders placed during the hospital encounter of 06/16/13 (from the past 48 hour(s))   CBC Status: Abnormal    Collection Time    06/16/13 4:40 PM   Result  Value  Range    WBC  16.6 (*)  4.0 - 10.5 K/uL    RBC  5.20  4.22 - 5.81 MIL/uL    Hemoglobin  16.0  13.0 - 17.0 g/dL    HCT  40.9  81.1 - 91.4 %    MCV  87.3  78.0 - 100.0 fL    MCH  30.8  26.0 - 34.0 pg    MCHC  35.2  30.0 - 36.0 g/dL    RDW  78.2  95.6 - 21.3 %    Platelets  281  150 - 400 K/uL   COMPREHENSIVE METABOLIC PANEL Status: Abnormal    Collection Time    06/16/13 4:40 PM   Result  Value  Range    Sodium  141  135 - 145 mEq/L    Potassium  4.0  3.5 - 5.1 mEq/L    Chloride   102  96 - 112 mEq/L    CO2  27  19 - 32 mEq/L    Glucose, Bld  109 (*)  70 - 99 mg/dL    BUN  17  6 - 23 mg/dL    Creatinine, Ser  0.86  0.50 - 1.35 mg/dL    Calcium  9.9  8.4 - 10.5 mg/dL    Total Protein  8.6 (*)  6.0 - 8.3 g/dL    Albumin  4.6  3.5 - 5.2 g/dL    AST  85 (*)  0 - 37 U/L    ALT  55 (*)  0 - 53 U/L    Alkaline Phosphatase  95  39 - 117 U/L    Total Bilirubin  0.5  0.3 - 1.2 mg/dL    GFR calc non Af Amer  >90  >90 mL/min    GFR calc Af Amer  >90  >90 mL/min    Comment:  (NOTE)     The eGFR has been calculated  using the CKD EPI equation.     This calculation has not been validated in all clinical situations.     eGFR's persistently <90 mL/min signify possible Chronic Kidney     Disease.   LIPASE, BLOOD Status: Abnormal    Collection Time    06/16/13 4:40 PM   Result  Value  Range    Lipase  >3000 (*)  11 - 59 U/L   US Abdomen Complete  06/16/2013 CLINICAL DATA: Abdomen pain EXAM: ULTRASOUND ABDOMEN COMPLETE COMPARISON: None. FINDINGS: Gallbladder: There are sludge and stones within the gallbladder. No sonographic Murphy sign noted. Common bile duct: Diameter: 5.7 mm Liver: There is diffuse increased echotexture the visualized right lobe liver. The left lobe liver is not visualized due to overlying bowel gas and patient body habitus. IVC: Not well seen due to overlying bowel gas. Pancreas: Not well seen due to overlying bowel gas. Spleen: Size and appearance within normal limits. Right Kidney: Length: 11.8 cm. Echogenicity within normal limits. No mass or hydronephrosis visualized. Left Kidney: Length: 11.7 cm. Echogenicity within normal limits. No mass or hydronephrosis visualized. Abdominal aorta: No aneurysm visualized in the proximal and mid aorta. The distal aorta is not well seen due to overlying bowel gas. Other findings: None. IMPRESSION: Diffuse increased echotexture of the liver with low echogenicity around the gallbladder fossa probably due to fatty infiltration of  liver with focal fatty sparing. Midline structures as described are not well seen due to overlying bowel gas. Cholelithiasis without sonographic evidence of acute cholecystitis. Electronically Signed By: Sherian Rein M.D. On: 06/16/2013 18:26  Dg Abd Acute W/chest  06/16/2013 CLINICAL DATA: Epigastric pain with nausea and vomiting. EXAM: ACUTE ABDOMEN SERIES (ABDOMEN 2 VIEW & CHEST 1 VIEW) COMPARISON: None. FINDINGS: Frontal view of the chest demonstrates midline trachea. Heart size upper normal, accentuated by low lung volumes. No pleural effusion or pneumothorax. Bibasilar volume loss with patchy airspace disease. Abdominal films demonstrate no free intraperitoneal air or significant air-fluid levels on upright positioning. Minimal motion degradation on upright view. Supine view demonstrates mild paucity of upper abdominal bowel gas. Distal gas and stool. No abnormal abdominal calcifications. No appendicolith. Minimal convex right lumbar spine curvature. IMPRESSION: Nonspecific bowel gas pattern, with paucity of upper abdominal small bowel gas. No obstruction or free intraperitoneal air identified. Bibasilar volume loss with patchy atelectasis. Electronically Signed By: Jeronimo Greaves M.D. On: 06/16/2013 17:20  ROS  All other review of systems negative or noncontributory except as stated in the HPI  Blood pressure 189/107, pulse 67, temperature 97.8 F (36.6 C), temperature source Oral, resp. rate 20, SpO2 97.00%.  General appearance: alert, cooperative and no distress  Head: Normocephalic, without obvious abnormality, atraumatic  Neck: no JVD and supple, symmetrical, trachea midline  Resp: clear to auscultation bilaterally  Cardio: normal rate, regular rhythm  GI: soft, upper abdominal tenderness, greatest in epigastrium, no peritonitis  Extremities: extremities normal, atraumatic, no cyanosis or edema  Pulses: 2+ and symmetric  Skin: Skin color, texture, turgor normal. No rashes or lesions   Neurologic: Grossly normal  Assessment/Plan:  Gallstone pancreatitis-plan laparoscopic cholecystectomy

## 2013-06-19 NOTE — Progress Notes (Signed)
  Subjective: He feels fine this Am.  No pain.  Objective: Vital signs in last 24 hours: Temp:  [97.8 F (36.6 C)-99.7 F (37.6 C)] 97.8 F (36.6 C) (12/05 0519) Pulse Rate:  [69-73] 69 (12/05 0519) Resp:  [16-18] 18 (12/05 0519) BP: (125-137)/(73-92) 135/92 mmHg (12/05 0519) SpO2:  [96 %-98 %] 96 % (12/05 0519) Last BM Date: 06/19/13 Afebrile VSS Labs OK lipase down to 170 Intake/Output from previous day: 12/04 0701 - 12/05 0700 In: 3685 [I.V.:3685] Out: -  Intake/Output this shift:    General appearance: alert, cooperative and no distress GI: soft, non-tender; bowel sounds normal; no masses,  no organomegaly  Lab Results:   Recent Labs  06/18/13 0537 06/19/13 0516  WBC 11.2* 10.9*  HGB 14.0 13.0  HCT 40.2 38.0*  PLT 222 215    BMET  Recent Labs  06/18/13 0537 06/19/13 0516  NA 135 136  K 3.8 3.9  CL 101 102  CO2 25 26  GLUCOSE 112* 115*  BUN 9 9  CREATININE 0.66 0.73  CALCIUM 8.4 8.7   PT/INR  Recent Labs  06/17/13 0532 06/18/13 0537  LABPROT 13.4 14.1  INR 1.04 1.11     Recent Labs Lab 06/16/13 1640 06/17/13 0532 06/18/13 0537 06/19/13 0516  AST 85* 42* 24 15  ALT 55* 49 30 21  ALKPHOS 95 80 72 68  BILITOT 0.5 0.7 0.7 0.6  PROT 8.6* 6.9 6.8 6.5  ALBUMIN 4.6 3.6 3.3* 3.0*     Lipase     Component Value Date/Time   LIPASE 170* 06/19/2013 0516     Studies/Results: Ct Abdomen Pelvis W Contrast  06/17/2013   CLINICAL DATA:  Pancreatitis, mid abdominal pain  EXAM: CT ABDOMEN AND PELVIS WITH CONTRAST  TECHNIQUE: Multidetector CT imaging of the abdomen and pelvis was performed using the standard protocol following bolus administration of intravenous contrast. Sagittal and coronal MPR images reconstructed from axial data set.  CONTRAST:  OMNIPAQUE IOHEXOL 300 MG/ML SOLN. Dilute oral contrast.  COMPARISON:  None  FINDINGS: Dependent atelectasis at both lung bases.  Enlarged edematous pancreas with peripancreatic infiltrative  changes consistent with acute pancreatitis.  Fluid identified along Gerota's fascia to the lateral conal fascia bilaterally greater on left.  No evidence of pancreatic pseudocyst, hemorrhage or necrosis.  No ductal dilatation or calcifications seen.  Vascular structures appear patent.  Cyst within lateral segment left lobe liver 2.8 x 3.2 cm image 18.  Liver, spleen, kidneys, and adrenal glands normal appearance.  Normal appendix.  Low-attenuation free pelvic fluid.  Stomach and bowel loops normal appearance.  Unremarkable bladder in ureters.  No mass, adenopathy, free air, or acute osseous findings.  IMPRESSION: Acute uncomplicated pancreatitis.  Nonspecific free pelvic fluid.  Hepatic cyst.   Electronically Signed   By: Ulyses Southward M.D.   On: 06/17/2013 17:53    Medications: . enoxaparin (LOVENOX) injection  40 mg Subcutaneous Q24H  . sodium chloride  3 mL Intravenous Q12H    Assessment/Plan Gallstone pancreatitis   Plan:  OR later today   LOS: 3 days    Lannah Koike 06/19/2013

## 2013-06-19 NOTE — Op Note (Signed)
Javi Vilchis-Loza @date @  Procedure: Laparoscopic Cholecystectomy with intraoperative cholangiogram  Surgeon: Wenda Low, MD, FACS Asst:  none  Anes:  General  Drains: None  Findings: Slight edema of gallbladder; normal IOC  Description of Procedure: The patient was taken to OR 11 and given general anesthesia.  The patient was prepped with PCMX and draped sterilely. A time out was performed.  Access to the abdomen was achieved with [facility] 12 thru the umbilicus.  Port placement included an upper midline 11 and two 5 mm trocars laterally.    The gallbladder was visualized and the fundus was grasped and the gallbladder was elevated. Traction on the infundibulum allowed for successful demonstration of the critical view. Inflammatory changes were minimal.  The cystic duct was identified and clipped up on the gallbladder and an incision was made in the cystic duct and the Reddick catheter was inserted after milking the cystic duct of any debris. A dynamic cholangiogram was performed which demonstrated intrahepatic filling and free flow into the duodenum.    The cystic duct was then triple clipped and divided, the cystic artery was double clipped and divided and then the gallbladder was removed from the gallbladder bed. Removal of the gallbladder from the gallbladder bed was uneventful.  The gallbladder was then placed in a bag and brought out through one of the 10 mm trocar sites. The gallbladder bed was inspected and no bleeding or bile leaks were seen.   Laparoscopic visualization was used when closing fascial defects for trocar sites.   Incisions were injected with marcaine and closed with 4-0 Vicryl and steristrips on the skin.  Sponge and needle count were correct.    The patient was taken to the recovery room in satisfactory condition.

## 2013-06-19 NOTE — Progress Notes (Signed)
Called OR to find out time of patient's surgery. Spoke with [place], rn. Surgery has been scheduled for btw 1300 to 1330. Pt to be picked up at 1200. Pt and wife made aware. Both said "thank you". Vwilliams,rn.

## 2013-06-19 NOTE — Interval H&P Note (Signed)
History and Physical Interval Note:  06/19/2013 3:37 PM  Brandon Hood  has presented today for surgery, with the diagnosis of gallstones pancreatitis  The various methods of treatment have been discussed with the patient and family. After consideration of risks, benefits and other options for treatment, the patient has consented to  Procedure(s): LAPAROSCOPIC CHOLECYSTECTOMY WITH INTRAOPERATIVE CHOLANGIOGRAM (N/A) as a surgical intervention .  The patient's history has been reviewed, patient examined, no change in status, stable for surgery.  I have reviewed the patient's chart and labs.  Questions were answered to the patient's satisfaction.     Enyla Lisbon B

## 2013-06-19 NOTE — Anesthesia Postprocedure Evaluation (Signed)
  Anesthesia Post-op Note  Patient: Brandon Hood  Procedure(s) Performed: Procedure(s) (LRB): LAPAROSCOPIC CHOLECYSTECTOMY WITH INTRAOPERATIVE CHOLANGIOGRAM (N/A)  Patient Location: PACU  Anesthesia Type: General  Level of Consciousness: awake and alert   Airway and Oxygen Therapy: Patient Spontanous Breathing  Post-op Pain: mild  Post-op Assessment: Post-op Vital signs reviewed, Patient's Cardiovascular Status Stable, Respiratory Function Stable, Patent Airway and No signs of Nausea or vomiting  Last Vitals:  Filed Vitals:   06/19/13 1845  BP: 169/89  Pulse: 77  Temp: 37.2 C  Resp: 18    Post-op Vital Signs: stable   Complications: No apparent anesthesia complications

## 2013-06-19 NOTE — Anesthesia Preprocedure Evaluation (Addendum)
Anesthesia Evaluation  Patient identified by MRN, date of birth, ID band Patient awake    Reviewed: Allergy & Precautions, H&P , NPO status , Patient's Chart, lab work & pertinent test results  Airway Mallampati: II TM Distance: >3 FB Neck ROM: full    Dental  (+) Chipped and Dental Advisory Given 2 front upper are both broken off:   Pulmonary neg pulmonary ROS,  breath sounds clear to auscultation  Pulmonary exam normal       Cardiovascular Exercise Tolerance: Good hypertension, Rhythm:regular Rate:Normal     Neuro/Psych negative neurological ROS  negative psych ROS   GI/Hepatic negative GI ROS, Neg liver ROS, pancreatitis   Endo/Other  negative endocrine ROS  Renal/GU negative Renal ROS  negative genitourinary   Musculoskeletal   Abdominal   Peds  Hematology negative hematology ROS (+)   Anesthesia Other Findings   Reproductive/Obstetrics negative OB ROS                          Anesthesia Physical Anesthesia Plan  ASA: II  Anesthesia Plan: General   Post-op Pain Management:    Induction: Intravenous  Airway Management Planned: Oral ETT  Additional Equipment:   Intra-op Plan:   Post-operative Plan: Extubation in OR  Informed Consent: I have reviewed the patients History and Physical, chart, labs and discussed the procedure including the risks, benefits and alternatives for the proposed anesthesia with the patient or authorized representative who has indicated his/her understanding and acceptance.   Dental Advisory Given  Plan Discussed with: CRNA and Surgeon  Anesthesia Plan Comments:         Anesthesia Quick Evaluation

## 2013-06-20 LAB — COMPREHENSIVE METABOLIC PANEL
Albumin: 3.2 g/dL — ABNORMAL LOW (ref 3.5–5.2)
Alkaline Phosphatase: 88 U/L (ref 39–117)
BUN: 11 mg/dL (ref 6–23)
Calcium: 9.2 mg/dL (ref 8.4–10.5)
Chloride: 101 mEq/L (ref 96–112)
Potassium: 4.2 mEq/L (ref 3.5–5.1)
Total Bilirubin: 0.5 mg/dL (ref 0.3–1.2)
Total Protein: 7.5 g/dL (ref 6.0–8.3)

## 2013-06-20 LAB — CBC WITH DIFFERENTIAL/PLATELET
Eosinophils Relative: 0 % (ref 0–5)
HCT: 39.2 % (ref 39.0–52.0)
Hemoglobin: 13.6 g/dL (ref 13.0–17.0)
Lymphocytes Relative: 11 % — ABNORMAL LOW (ref 12–46)
Lymphs Abs: 1.2 10*3/uL (ref 0.7–4.0)
MCV: 87.9 fL (ref 78.0–100.0)
Monocytes Absolute: 0.5 10*3/uL (ref 0.1–1.0)
Monocytes Relative: 5 % (ref 3–12)
Neutro Abs: 8.9 10*3/uL — ABNORMAL HIGH (ref 1.7–7.7)
RBC: 4.46 MIL/uL (ref 4.22–5.81)
RDW: 12.9 % (ref 11.5–15.5)
WBC: 10.6 10*3/uL — ABNORMAL HIGH (ref 4.0–10.5)

## 2013-06-20 LAB — MAGNESIUM: Magnesium: 2.4 mg/dL (ref 1.5–2.5)

## 2013-06-20 MED ORDER — ONDANSETRON HCL 4 MG/2ML IJ SOLN: 4.0000 mg | Freq: Four times a day (QID) | INTRAMUSCULAR | Status: DC | PRN

## 2013-06-20 MED ORDER — HEPARIN SODIUM (PORCINE) 5000 UNIT/ML IJ SOLN
5000.0000 [IU] | Freq: Three times a day (TID) | INTRAMUSCULAR | Status: DC
  Administered 2013-06-20 – 2013-06-21 (×3): 5000 [IU] via SUBCUTANEOUS
  Filled 2013-06-20 (×6): qty 1

## 2013-06-20 MED ORDER — SODIUM CHLORIDE 0.9 % IV SOLN
INTRAVENOUS | Status: DC
  Administered 2013-06-20 – 2013-06-21 (×3): via INTRAVENOUS
  Filled 2013-06-20 (×4): qty 1000

## 2013-06-20 MED ORDER — MORPHINE SULFATE 2 MG/ML IJ SOLN: 1.0000 mg | INTRAMUSCULAR | Status: DC | PRN

## 2013-06-20 MED ORDER — HYDROCODONE-ACETAMINOPHEN 5-325 MG PO TABS: 1.0000 | ORAL_TABLET | ORAL | Status: DC | PRN

## 2013-06-20 MED ORDER — KCL IN DEXTROSE-NACL 20-5-0.45 MEQ/L-%-% IV SOLN
INTRAVENOUS | Status: DC
  Filled 2013-06-20 (×2): qty 1000

## 2013-06-20 MED ORDER — ONDANSETRON HCL 4 MG PO TABS: 4.0000 mg | ORAL_TABLET | Freq: Four times a day (QID) | ORAL | Status: DC | PRN

## 2013-06-20 NOTE — Progress Notes (Signed)
[facility] PROGRESS NOTE  Brandon Hood ZOX:[id number] DOB: April 29, 1961 DOA: 06/16/2013 PCP: No primary provider on file.  Assessment/Plan: Principal Problem:   Pancreatitis, acute Likely secondary to gallstone pancreatitis. Improving. Patient denies any abdominal pain. No nausea. No emesis. - Patient is status post laparoscopic cholecystectomy with IOC 06/19/2013. - Decrease IV fluids at 100 cc per hour.  - Lipase trending down from 3000, and currently at 99 today. Patient tolerating clear liquids. Advance to full liquid diet.  Will follow lipase levels in the morning.   Active Problems:   Abdominal pain, epigastric - Secondary to acute pancreatitis. Clinical improvement. Patient denies any further abdominal pain. No nausea no vomiting. Lipase trending down. Patient is status post laparoscopic cholecystectomy.    Nausea with vomiting - Secondary to primary problem and improved - Continue anti-emetics, supportive care. Tolerating clear liquids. Advance to full liquid diet.   Elevated blood pressure - Likely secondary to pain. Improved. Current blood pressure is 128/72. Follow.   Cholelithiasis - Patient is status post laparoscopic cholecystectomy with IOC 06/19/2013 per Dr. [name]. Outpatient followup with general surgery 3 weeks post discharge.    Elevated LFTs - Currently trending down  Code Status: Full Family Communication: Updated patient at bedside. Disposition Plan: Home when medically stable hopefully one to 2 days.   Consultants:  Gen. Surgery: Dr. Biagio Quint  Procedures:  CT abd/pelvis 06/17/13  Acute abdominal series 06/16/2013  Laparoscopic cholecystectomy with IOC 06/19/2013 per Dr. [name]  Antibiotics:  None  HPI/Subjective: Patient states epigastric pain improved. No nausea. No vomiting.  Objective: Filed Vitals:   06/20/13 0537  BP: 128/72  Pulse: 64  Temp: 98.1 F (36.7 C)  Resp: 17    Intake/Output Summary (Last 24 hours) at  06/20/13 1341 Last data filed at 06/19/13 1800  Gross per 24 hour  Intake   1500 ml  Output    900 ml  Net    600 ml   Filed Weights   06/16/13 2027  Weight: 89.1 kg (196 lb 6.9 oz)    Exam:   General:  Pt in NAD, Alert and awake  Cardiovascular: RRR, no MRG  Respiratory: CTA BL, no wheezes  Abdomen: Soft, nontender, nondistended, positive bowel sounds, incision site clean dry intact.  Musculoskeletal: no cyanosis or clubbing   Data Reviewed: Basic Metabolic Panel:  Recent Labs Lab 06/16/13 1640 06/17/13 0532 06/18/13 0537 06/19/13 0516 06/20/13 0541  NA 141 138 135 136 136  K 4.0 3.9 3.8 3.9 4.2  CL 102 104 101 102 101  CO2 27 23 25 26 27   GLUCOSE 109* 132* 112* 115* 123*  BUN 17 13 9 9 11   CREATININE 0.80 0.77 0.66 0.73 0.67  CALCIUM 9.9 8.6 8.4 8.7 9.2  MG  --   --   --   --  2.4   Liver Function Tests:  Recent Labs Lab 06/16/13 1640 06/17/13 0532 06/18/13 0537 06/19/13 0516 06/20/13 0541  AST 85* 42* 24 15 53*  ALT 55* 49 30 21 47  ALKPHOS 95 80 72 68 88  BILITOT 0.5 0.7 0.7 0.6 0.5  PROT 8.6* 6.9 6.8 6.5 7.5  ALBUMIN 4.6 3.6 3.3* 3.0* 3.2*    Recent Labs Lab 06/17/13 0532 06/17/13 0930 06/18/13 0537 06/19/13 0516 06/20/13 0541  LIPASE 1627* 1794* 701* 170* 99*   No results found for this basename: AMMONIA,  in the last 168 hours CBC:  Recent Labs Lab 06/16/13 1640 06/17/13 0532 06/18/13 0537 06/19/13 0516 06/20/13 0541  WBC 16.6*  11.6* 11.2* 10.9* 10.6*  NEUTROABS  --   --   --   --  8.9*  HGB 16.0 14.4 14.0 13.0 13.6  HCT 45.4 42.3 40.2 38.0* 39.2  MCV 87.3 88.3 87.6 88.0 87.9  PLT 281 223 222 215 269   Cardiac Enzymes: No results found for this basename: CKTOTAL, CKMB, CKMBINDEX, TROPONINI,  in the last 168 hours BNP (last 3 results) No results found for this basename: PROBNP,  in the last 8760 hours CBG: No results found for this basename: GLUCAP,  in the last 168 hours  Recent Results (from the past 240 hour(s))   SURGICAL PCR SCREEN     Status: None   Collection Time    06/19/13  5:24 AM      Result Value Range Status   MRSA, PCR NEGATIVE  NEGATIVE Final   Staphylococcus aureus NEGATIVE  NEGATIVE Final   Comment:            The Xpert SA Assay (FDA     approved for NASAL specimens     in patients over 73 years of age),     is one component of     a comprehensive surveillance     program.  Test performance has     been validated by [employer] for patients greater     than or equal to 31 year old.     It is not intended     to diagnose infection nor to     guide or monitor treatment.     Studies: Dg Cholangiogram Operative  06/19/2013   CLINICAL DATA:  Cholecystectomy for cholelithiasis.  EXAM: INTRAOPERATIVE CHOLANGIOGRAM  TECHNIQUE: Cholangiographic images from the C-arm fluoroscopic device were submitted for interpretation post-operatively. Please see the procedural report for the amount of contrast and the fluoroscopy time utilized.  COMPARISON:  Abdomen CT dated 06/17/2013 and abdomen ultrasound dated 06/16/2013.  FINDINGS: Normal-appearing intrahepatic and extrahepatic bile ducts and cystic duct remnant. No intraductal filling defects. Free flow of contrast into the duodenum. Cholecystectomy clips.  IMPRESSION: Normal examination.   Electronically Signed   By: Gordan Payment M.D.   On: 06/19/2013 20:06    Scheduled Meds: . heparin subcutaneous  5,000 Units Subcutaneous Q8H  . sodium chloride  3 mL Intravenous Q12H   Continuous Infusions: . dextrose 5 % and 0.45 % NaCl with KCl 20 mEq/L 100 mL/hr at 06/20/13 1100    Principal Problem:   Pancreatitis, acute Active Problems:   Abdominal pain, epigastric   Nausea with vomiting   High blood pressure   Acute pancreatitis   Cholelithiasis   Elevated LFTs   Elevated blood pressure    Time spent: > 35 minutes    THOMPSON,DANIEL M.D. [facility] Pager [phone] If 7PM-7AM, please contact night-coverage at [email or web address],  password [facility] 06/20/2013, 1:41 PM  LOS: 4 days

## 2013-06-20 NOTE — Progress Notes (Signed)
Patient ID: Brandon Hood, male   DOB: [birth year], 52 y.o.   MRN: [id number] [facility] Progress Note:   1 Day Post-Op  Subjective: Mental status is clear.  No complaints Objective: Vital signs in last 24 hours: Temp:  [97.8 F (36.6 C)-99 F (37.2 C)] 98.1 F (36.7 C) (12/06 0537) Pulse Rate:  [64-100] 64 (12/06 0537) Resp:  [16-20] 17 (12/06 0537) BP: (128-169)/(72-97) 128/72 mmHg (12/06 0537) SpO2:  [99 %-100 %] 99 % (12/06 0537)  Intake/Output from previous day: 12/05 0701 - 12/06 0700 In: 2512.5 [I.V.:2512.5] Out: 900 [Urine:900] Intake/Output this shift:    Physical Exam: Work of breathing is normal.  No appreciable abdominal pain.    Lab Results:  Results for orders placed during the hospital encounter of 06/16/13 (from the past 48 hour(s))  COMPREHENSIVE METABOLIC PANEL     Status: Abnormal   Collection Time    06/19/13  5:16 AM      Result Value Range   Sodium 136  135 - 145 mEq/L   Potassium 3.9  3.5 - 5.1 mEq/L   Chloride 102  96 - 112 mEq/L   CO2 26  19 - 32 mEq/L   Glucose, Bld 115 (*) 70 - 99 mg/dL   BUN 9  6 - 23 mg/dL   Creatinine, Ser 4.09  0.50 - 1.35 mg/dL   Calcium 8.7  8.4 - 81.1 mg/dL   Total Protein 6.5  6.0 - 8.3 g/dL   Albumin 3.0 (*) 3.5 - 5.2 g/dL   AST 15  0 - 37 U/L   ALT 21  0 - 53 U/L   Alkaline Phosphatase 68  39 - 117 U/L   Total Bilirubin 0.6  0.3 - 1.2 mg/dL   GFR calc non Af Amer >90  >90 mL/min   GFR calc Af Amer >90  >90 mL/min   Comment: (NOTE)     The eGFR has been calculated using the CKD EPI equation.     This calculation has not been validated in all clinical situations.     eGFR's persistently <90 mL/min signify possible Chronic Kidney     Disease.  LIPASE, BLOOD     Status: Abnormal   Collection Time    06/19/13  5:16 AM      Result Value Range   Lipase 170 (*) 11 - 59 U/L  CBC     Status: Abnormal   Collection Time    06/19/13  5:16 AM      Result Value Range   WBC 10.9 (*) 4.0 - 10.5 K/uL   RBC 4.32  4.22 - 5.81 MIL/uL   Hemoglobin 13.0  13.0 - 17.0 g/dL   HCT 91.4 (*) 78.2 - 95.6 %   MCV 88.0  78.0 - 100.0 fL   MCH 30.1  26.0 - 34.0 pg   MCHC 34.2  30.0 - 36.0 g/dL   RDW 21.3  08.6 - 57.8 %   Platelets 215  150 - 400 K/uL  SURGICAL PCR SCREEN     Status: None   Collection Time    06/19/13  5:24 AM      Result Value Range   MRSA, PCR NEGATIVE  NEGATIVE   Staphylococcus aureus NEGATIVE  NEGATIVE   Comment:            The Xpert SA Assay (FDA     approved for NASAL specimens     in patients over 79 years of age),     is  one component of     a comprehensive surveillance     program.  Test performance has     been validated by [facility] for patients greater     than or equal to 34 year old.     It is not intended     to diagnose infection nor to     guide or monitor treatment.  COMPREHENSIVE METABOLIC PANEL     Status: Abnormal   Collection Time    06/20/13  5:41 AM      Result Value Range   Sodium 136  135 - 145 mEq/L   Potassium 4.2  3.5 - 5.1 mEq/L   Chloride 101  96 - 112 mEq/L   CO2 27  19 - 32 mEq/L   Glucose, Bld 123 (*) 70 - 99 mg/dL   BUN 11  6 - 23 mg/dL   Creatinine, Ser 4.54  0.50 - 1.35 mg/dL   Calcium 9.2  8.4 - 09.8 mg/dL   Total Protein 7.5  6.0 - 8.3 g/dL   Albumin 3.2 (*) 3.5 - 5.2 g/dL   AST 53 (*) 0 - 37 U/L   ALT 47  0 - 53 U/L   Alkaline Phosphatase 88  39 - 117 U/L   Total Bilirubin 0.5  0.3 - 1.2 mg/dL   GFR calc non Af Amer >90  >90 mL/min   GFR calc Af Amer >90  >90 mL/min   Comment: (NOTE)     The eGFR has been calculated using the CKD EPI equation.     This calculation has not been validated in all clinical situations.     eGFR's persistently <90 mL/min signify possible Chronic Kidney     Disease.  CBC WITH DIFFERENTIAL     Status: Abnormal   Collection Time    06/20/13  5:41 AM      Result Value Range   WBC 10.6 (*) 4.0 - 10.5 K/uL   RBC 4.46  4.22 - 5.81 MIL/uL   Hemoglobin 13.6  13.0 - 17.0 g/dL   HCT 11.9  14.7  - 82.9 %   MCV 87.9  78.0 - 100.0 fL   MCH 30.5  26.0 - 34.0 pg   MCHC 34.7  30.0 - 36.0 g/dL   RDW 56.2  13.0 - 86.5 %   Platelets 269  150 - 400 K/uL   Neutrophils Relative % 84 (*) 43 - 77 %   Neutro Abs 8.9 (*) 1.7 - 7.7 K/uL   Lymphocytes Relative 11 (*) 12 - 46 %   Lymphs Abs 1.2  0.7 - 4.0 K/uL   Monocytes Relative 5  3 - 12 %   Monocytes Absolute 0.5  0.1 - 1.0 K/uL   Eosinophils Relative 0  0 - 5 %   Eosinophils Absolute 0.0  0.0 - 0.7 K/uL   Basophils Relative 0  0 - 1 %   Basophils Absolute 0.0  0.0 - 0.1 K/uL  LIPASE, BLOOD     Status: Abnormal   Collection Time    06/20/13  5:41 AM      Result Value Range   Lipase 99 (*) 11 - 59 U/L  MAGNESIUM     Status: None   Collection Time    06/20/13  5:41 AM      Result Value Range   Magnesium 2.4  1.5 - 2.5 mg/dL    Radiology/Results: Dg Cholangiogram Operative  06/19/2013   CLINICAL DATA:  Cholecystectomy for cholelithiasis.  EXAM: INTRAOPERATIVE CHOLANGIOGRAM  TECHNIQUE: Cholangiographic images from the C-arm fluoroscopic device were submitted for interpretation post-operatively. Please see the procedural report for the amount of contrast and the fluoroscopy time utilized.  COMPARISON:  Abdomen CT dated 06/17/2013 and abdomen ultrasound dated 06/16/2013.  FINDINGS: Normal-appearing intrahepatic and extrahepatic bile ducts and cystic duct remnant. No intraductal filling defects. Free flow of contrast into the duodenum. Cholecystectomy clips.  IMPRESSION: Normal examination.   Electronically Signed   By: Gordan Payment M.D.   On: 06/19/2013 20:06    Anti-infectives: Anti-infectives   Start     Dose/Rate Route Frequency Ordered Stop   06/19/13 1600  ceFAZolin (ANCEF) IVPB 2 g/50 mL premix     2 g 100 mL/hr over 30 Minutes Intravenous  Once 06/19/13 1554 06/19/13 1622      Assessment/Plan: Problem List: Patient Active Problem List   Diagnosis Date Noted  . Elevated blood pressure 06/18/2013  . Pancreatitis 06/16/2013  .  Pancreatitis, acute 06/16/2013  . Abdominal pain, epigastric 06/16/2013  . Nausea with vomiting 06/16/2013  . High blood pressure 06/16/2013  . Acute pancreatitis 06/16/2013  . Cholelithiasis 06/16/2013  . Elevated LFTs 06/16/2013    OK to discharge;  Shower and get incisions wet.  Will see back in the office in 3 weeks 1 Day Post-Op    LOS: 4 days   Matt B. [name], MD, [facility] [phone] beeper [phone]  06/20/2013 11:17 AM

## 2013-06-21 DIAGNOSIS — Z9889 Other specified postprocedural states: Secondary | ICD-10-CM

## 2013-06-21 LAB — COMPREHENSIVE METABOLIC PANEL
Albumin: 2.9 g/dL — ABNORMAL LOW (ref 3.5–5.2)
BUN: 13 mg/dL (ref 6–23)
Chloride: 103 mEq/L (ref 96–112)
Creatinine, Ser: 0.71 mg/dL (ref 0.50–1.35)
GFR calc Af Amer: >90 mL/min (ref >90)
Total Bilirubin: 0.3 mg/dL (ref 0.3–1.2)
Total Protein: 6.4 g/dL (ref 6.0–8.3)

## 2013-06-21 LAB — CBC
HCT: 35.3 % — ABNORMAL LOW (ref 39.0–52.0)
MCV: 88 fL (ref 78.0–100.0)
Platelets: 263 10*3/uL (ref 150–400)
RBC: 4.01 MIL/uL — ABNORMAL LOW (ref 4.22–5.81)
WBC: 9.9 10*3/uL (ref 4.0–10.5)

## 2013-06-21 MED ORDER — HYDROCODONE-ACETAMINOPHEN 5-325 MG PO TABS: 1.0000 | ORAL_TABLET | ORAL | Status: DC | PRN

## 2013-06-21 NOTE — Progress Notes (Signed)
Patient ID: Brandon Hood, male   DOB: January 21, 1961, 52 y.o.   MRN: [id number] [facility] Progress Note:   2 Days Post-Op  Subjective: Mental status is clear Objective: Vital signs in last 24 hours: Temp:  [98.4 F (36.9 C)-98.9 F (37.2 C)] 98.4 F (36.9 C) (12/07 0545) Pulse Rate:  [57-91] 57 (12/07 0545) Resp:  [18] 18 (12/07 0545) BP: (122-143)/(72-81) 122/72 mmHg (12/07 0545) SpO2:  [94 %-98 %] 94 % (12/07 0545) Weight:  [194 lb 3.6 oz (88.1 kg)] 194 lb 3.6 oz (88.1 kg) (12/07 0545)  Intake/Output from previous day: 12/06 0701 - 12/07 0700 In: 1505 [I.V.:1505] Out: -  Intake/Output this shift: Total I/O In: 1505 [I.V.:1505] Out: -   Physical Exam: Work of breathing is normal.  No pain.   Lab Results:  Results for orders placed during the hospital encounter of 06/16/13 (from the past 48 hour(s))  COMPREHENSIVE METABOLIC PANEL     Status: Abnormal   Collection Time    06/20/13  5:41 AM      Result Value Range   Sodium 136  135 - 145 mEq/L   Potassium 4.2  3.5 - 5.1 mEq/L   Chloride 101  96 - 112 mEq/L   CO2 27  19 - 32 mEq/L   Glucose, Bld 123 (*) 70 - 99 mg/dL   BUN 11  6 - 23 mg/dL   Creatinine, Ser 4.09  0.50 - 1.35 mg/dL   Calcium 9.2  8.4 - 81.1 mg/dL   Total Protein 7.5  6.0 - 8.3 g/dL   Albumin 3.2 (*) 3.5 - 5.2 g/dL   AST 53 (*) 0 - 37 U/L   ALT 47  0 - 53 U/L   Alkaline Phosphatase 88  39 - 117 U/L   Total Bilirubin 0.5  0.3 - 1.2 mg/dL   GFR calc non Af Amer >90  >90 mL/min   GFR calc Af Amer >90  >90 mL/min   Comment: (NOTE)     The eGFR has been calculated using the CKD EPI equation.     This calculation has not been validated in all clinical situations.     eGFR's persistently <90 mL/min signify possible Chronic Kidney     Disease.  CBC WITH DIFFERENTIAL     Status: Abnormal   Collection Time    06/20/13  5:41 AM      Result Value Range   WBC 10.6 (*) 4.0 - 10.5 K/uL   RBC 4.46  4.22 - 5.81 MIL/uL   Hemoglobin 13.6  13.0 -  17.0 g/dL   HCT 91.4  78.2 - 95.6 %   MCV 87.9  78.0 - 100.0 fL   MCH 30.5  26.0 - 34.0 pg   MCHC 34.7  30.0 - 36.0 g/dL   RDW 21.3  08.6 - 57.8 %   Platelets 269  150 - 400 K/uL   Neutrophils Relative % 84 (*) 43 - 77 %   Neutro Abs 8.9 (*) 1.7 - 7.7 K/uL   Lymphocytes Relative 11 (*) 12 - 46 %   Lymphs Abs 1.2  0.7 - 4.0 K/uL   Monocytes Relative 5  3 - 12 %   Monocytes Absolute 0.5  0.1 - 1.0 K/uL   Eosinophils Relative 0  0 - 5 %   Eosinophils Absolute 0.0  0.0 - 0.7 K/uL   Basophils Relative 0  0 - 1 %   Basophils Absolute 0.0  0.0 - 0.1 K/uL  LIPASE, BLOOD  Status: Abnormal   Collection Time    06/20/13  5:41 AM      Result Value Range   Lipase 99 (*) 11 - 59 U/L  MAGNESIUM     Status: None   Collection Time    06/20/13  5:41 AM      Result Value Range   Magnesium 2.4  1.5 - 2.5 mg/dL  COMPREHENSIVE METABOLIC PANEL     Status: Abnormal   Collection Time    06/21/13  5:21 AM      Result Value Range   Sodium 136  135 - 145 mEq/L   Potassium 3.9  3.5 - 5.1 mEq/L   Chloride 103  96 - 112 mEq/L   CO2 25  19 - 32 mEq/L   Glucose, Bld 97  70 - 99 mg/dL   BUN 13  6 - 23 mg/dL   Creatinine, Ser 6.96  0.50 - 1.35 mg/dL   Calcium 8.6  8.4 - 29.5 mg/dL   Total Protein 6.4  6.0 - 8.3 g/dL   Albumin 2.9 (*) 3.5 - 5.2 g/dL   AST 35  0 - 37 U/L   ALT 42  0 - 53 U/L   Alkaline Phosphatase 71  39 - 117 U/L   Total Bilirubin 0.3  0.3 - 1.2 mg/dL   GFR calc non Af Amer >90  >90 mL/min   GFR calc Af Amer >90  >90 mL/min   Comment: (NOTE)     The eGFR has been calculated using the CKD EPI equation.     This calculation has not been validated in all clinical situations.     eGFR's persistently <90 mL/min signify possible Chronic Kidney     Disease.  CBC     Status: Abnormal   Collection Time    06/21/13  5:21 AM      Result Value Range   WBC 9.9  4.0 - 10.5 K/uL   RBC 4.01 (*) 4.22 - 5.81 MIL/uL   Hemoglobin 12.3 (*) 13.0 - 17.0 g/dL   HCT 28.4 (*) 13.2 - 44.0 %   MCV  88.0  78.0 - 100.0 fL   MCH 30.7  26.0 - 34.0 pg   MCHC 34.8  30.0 - 36.0 g/dL   RDW 10.2  72.5 - 36.6 %   Platelets 263  150 - 400 K/uL  LIPASE, BLOOD     Status: Abnormal   Collection Time    06/21/13  5:21 AM      Result Value Range   Lipase 194 (*) 11 - 59 U/L    Radiology/Results: Dg Cholangiogram Operative  06/19/2013   CLINICAL DATA:  Cholecystectomy for cholelithiasis.  EXAM: INTRAOPERATIVE CHOLANGIOGRAM  TECHNIQUE: Cholangiographic images from the C-arm fluoroscopic device were submitted for interpretation post-operatively. Please see the procedural report for the amount of contrast and the fluoroscopy time utilized.  COMPARISON:  Abdomen CT dated 06/17/2013 and abdomen ultrasound dated 06/16/2013.  FINDINGS: Normal-appearing intrahepatic and extrahepatic bile ducts and cystic duct remnant. No intraductal filling defects. Free flow of contrast into the duodenum. Cholecystectomy clips.  IMPRESSION: Normal examination.   Electronically Signed   By: Gordan Payment M.D.   On: 06/19/2013 20:06    Anti-infectives: Anti-infectives   Start     Dose/Rate Route Frequency Ordered Stop   06/19/13 1600  ceFAZolin (ANCEF) IVPB 2 g/50 mL premix     2 g 100 mL/hr over 30 Minutes Intravenous  Once 06/19/13 1554 06/19/13 1622  Assessment/Plan: Problem List: Patient Active Problem List   Diagnosis Date Noted  . Elevated blood pressure 06/18/2013  . Pancreatitis 06/16/2013  . Pancreatitis, acute 06/16/2013  . Abdominal pain, epigastric 06/16/2013  . Nausea with vomiting 06/16/2013  . High blood pressure 06/16/2013  . Acute pancreatitis 06/16/2013  . Cholelithiasis 06/16/2013  . Elevated LFTs 06/16/2013    OK to discharge.  May return to work when able (1 week);  followup with me in 3-4 weeks.  2 Days Post-Op    LOS: 5 days   Matt B. [name], MD, [facility] [phone] beeper [phone]  06/21/2013 6:54 AM

## 2013-06-21 NOTE — Discharge Summary (Signed)
Physician Discharge Summary  Brandon Hood ION:[id number] DOB: Aug 30, 1960 DOA: 06/16/2013  PCP: No primary provider on file.  Admit date: 06/16/2013 Discharge date: 06/21/2013  Time spent: 70 minutes  Recommendations for Outpatient Follow-up:  1. Followup with Dr. Daphine Hood of Gen. surgery in 2-3 weeks.  Discharge Diagnoses:  Principal Problem:   Gallstone pancreatitis Active Problems:   Abdominal pain, epigastric   Nausea with vomiting   High blood pressure   Acute pancreatitis   Cholelithiasis   Elevated LFTs   Elevated blood pressure   Discharge Condition: Stable and improved  Diet recommendation: Fat modified diet  Filed Weights   06/16/13 2027 06/21/13 0545  Weight: 89.1 kg (196 lb 6.9 oz) 88.1 kg (194 lb 3.6 oz)    History of present illness:  52 yo male healthy comes in with sudden onset of epigastric and ruq abd pain at 3pm with over 4 episodes of n/v nonbloody. No fever. No diarrhea. Has his gallbladder still, and had an episode of pancreatitis about 6 months ago at outside facility. Feels much better with ivf, zofran and dilaudid in Ed. Denies any past medical history. Denies etoh use. Denies any new medications.    Hospital Course:  #1 acute pancreatitis/gallstone pancreatitis Patient was admitted with acute onset of epigastric abdominal pain which she had had 6 months prior to admission with similar symptoms. Patient did also have some nausea and emesis as well. Patient denied any alcohol use. Abdominal ultrasound which was done on day of admission showed cholelithiasis without acute cholecystitis. CT of the abdomen and pelvis did show acute uncomplicated pancreatitis with a hepatic cyst. Patient was placed on IV fluids, antiemetics, pain management and admitted. Lipase levels on admission was greater than 3000. Patient was also noted to have a leukocytosis which was felt to be likely reactive. Patient was hydrated aggressively with IV fluids and his lipase levels  were trending down. General surgical consultation was obtained and patient was seen in consultation by Dr. Biagio Quint and it was felt that patient needed to improve clinically in terms of his acute pancreatitis and when that flares had improved than consideration for laparoscopic cholecystectomy. Patient was monitored his lipase levels slowly trended down such that by day of discharge his lipase level was ordered and 94. Patient improved clinically with hydration bowel rest and supportive care.  Patient subsequently underwent laparoscopic cholecystectomy with IOC on 06/19/2013 per Dr. Daphine Hood which patient tolerated without any complications. Patient was subsequently started on clear liquid diet which was advanced and patient was tolerating a fat modified diet by day of discharge. Patient be discharged home in stable and improved condition to followup with Dr. Daphine Hood of general surgery 2-3 weeks post discharge. Patient be discharged in stable and improved condition.  #2 epigastric abdominal pain Secondary to problem #1. See problem #1.  #3 elevated blood pressure During the hospitalization patient was noted to have elevated blood pressures. Patient did not have any history of hypertension. It was felt patient's elevated blood pressures were secondary to pain. Once patient's pain was controlled and patient improved clinically elevated blood pressure had resolved. On day of discharge patient's blood pressure was 122/72.  #4 cholelithiasis Patient had presented with acute pancreatitis which was felt to be secondary to gallstone pancreatitis. Abdominal ultrasound which was obtained did show cholelithiasis without cholecystitis. Once patient is acute pancreatitis flare had improved clinically patient subsequently underwent a laparoscopic cholecystectomy with IOC on 06/19/2013 with Dr. Daphine Hood without any complications. Patient will followup with Dr. Daphine Hood of  Gen. surgery in 2-3 weeks.  Procedures: CT abd/pelvis  06/17/13  Acute abdominal series 06/16/2013  Laparoscopic cholecystectomy with IOC 06/19/2013 per Dr. Daphine Hood Abdominal ultrasound 06/16/2013     Consultations: Gen. Surgery: Dr. Biagio Quint   Discharge Exam: Filed Vitals:   06/21/13 0545  BP: 122/72  Pulse: 57  Temp: 98.4 F (36.9 C)  Resp: 18    General: NAD Cardiovascular: RRR Respiratory: CTAB  Discharge Instructions      Discharge Orders   Future Orders Complete By Expires   Diet general  As directed    Scheduling Instructions:     Fat modified diet   Discharge instructions  As directed    Comments:     Follow up with Dr Brandon Hood of General Surgery in 2-3 weeks. May shower.   Increase activity slowly  As directed        Medication List         HYDROcodone-acetaminophen 5-325 MG per tablet  Commonly known as:  NORCO/VICODIN  Take 1-2 tablets by mouth every 4 (four) hours as needed for moderate pain.       No Known Allergies Follow-up Information   Follow up with Luretha Murphy B, MD. Schedule an appointment as soon as possible for a visit in 2 weeks. (f/u in 2-3 weeks)    Specialty:  General Surgery   Contact information:   [address] [place] [address] [phone]        The results of significant diagnostics from this hospitalization (including imaging, microbiology, ancillary and laboratory) are listed below for reference.    Significant Diagnostic Studies: Dg Cholangiogram Operative  06/19/2013   CLINICAL DATA:  Cholecystectomy for cholelithiasis.  EXAM: INTRAOPERATIVE CHOLANGIOGRAM  TECHNIQUE: Cholangiographic images from the C-arm fluoroscopic device were submitted for interpretation post-operatively. Please see the procedural report for the amount of contrast and the fluoroscopy time utilized.  COMPARISON:  Abdomen CT dated 06/17/2013 and abdomen ultrasound dated 06/16/2013.  FINDINGS: Normal-appearing intrahepatic and extrahepatic bile ducts and cystic duct remnant. No  intraductal filling defects. Free flow of contrast into the duodenum. Cholecystectomy clips.  IMPRESSION: Normal examination.   Electronically Signed   By: Gordan Payment M.D.   On: 06/19/2013 20:06   US Abdomen Complete  06/16/2013   CLINICAL DATA:  Abdomen pain  EXAM: ULTRASOUND ABDOMEN COMPLETE  COMPARISON:  None.  FINDINGS: Gallbladder:  There are sludge and stones within the gallbladder. No sonographic Murphy sign noted.  Common bile duct:  Diameter: 5.7 mm  Liver:  There is diffuse increased echotexture the visualized right lobe liver. The left lobe liver is not visualized due to overlying bowel gas and patient body habitus.  IVC:  Not well seen due to overlying bowel gas.  Pancreas:  Not well seen due to overlying bowel gas.  Spleen:  Size and appearance within normal limits.  Right Kidney:  Length: 11.8 cm. Echogenicity within normal limits. No mass or hydronephrosis visualized.  Left Kidney:  Length: 11.7 cm. Echogenicity within normal limits. No mass or hydronephrosis visualized.  Abdominal aorta:  No aneurysm visualized in the proximal and mid aorta. The distal aorta is not well seen due to overlying bowel gas.  Other findings:  None.  IMPRESSION: Diffuse increased echotexture of the liver with low echogenicity around the gallbladder fossa probably due to fatty infiltration of liver with focal fatty sparing. Midline structures as described are not well seen due to overlying bowel gas. Cholelithiasis without sonographic evidence of acute cholecystitis.   Electronically  Signed   By: Sherian Rein M.D.   On: 06/16/2013 18:26   Ct Abdomen Pelvis W Contrast  06/17/2013   CLINICAL DATA:  Pancreatitis, mid abdominal pain  EXAM: CT ABDOMEN AND PELVIS WITH CONTRAST  TECHNIQUE: Multidetector CT imaging of the abdomen and pelvis was performed using the standard protocol following bolus administration of intravenous contrast. Sagittal and coronal MPR images reconstructed from axial data set.  CONTRAST:   OMNIPAQUE IOHEXOL 300 MG/ML SOLN. Dilute oral contrast.  COMPARISON:  None  FINDINGS: Dependent atelectasis at both lung bases.  Enlarged edematous pancreas with peripancreatic infiltrative changes consistent with acute pancreatitis.  Fluid identified along Gerota's fascia to the lateral conal fascia bilaterally greater on left.  No evidence of pancreatic pseudocyst, hemorrhage or necrosis.  No ductal dilatation or calcifications seen.  Vascular structures appear patent.  Cyst within lateral segment left lobe liver 2.8 x 3.2 cm image 18.  Liver, spleen, kidneys, and adrenal glands normal appearance.  Normal appendix.  Low-attenuation free pelvic fluid.  Stomach and bowel loops normal appearance.  Unremarkable bladder in ureters.  No mass, adenopathy, free air, or acute osseous findings.  IMPRESSION: Acute uncomplicated pancreatitis.  Nonspecific free pelvic fluid.  Hepatic cyst.   Electronically Signed   By: Ulyses Southward M.D.   On: 06/17/2013 17:53   Dg Abd Acute W/chest  06/16/2013   CLINICAL DATA:  Epigastric pain with nausea and vomiting.  EXAM: ACUTE ABDOMEN SERIES (ABDOMEN 2 VIEW & CHEST 1 VIEW)  COMPARISON:  None.  FINDINGS: Frontal view of the chest demonstrates midline trachea. Heart size upper normal, accentuated by low lung volumes. No pleural effusion or pneumothorax. Bibasilar volume loss with patchy airspace disease.  Abdominal films demonstrate no free intraperitoneal air or significant air-fluid levels on upright positioning. Minimal motion degradation on upright view.  Supine view demonstrates mild paucity of upper abdominal bowel gas. Distal gas and stool. No abnormal abdominal calcifications. No appendicolith. Minimal convex right lumbar spine curvature.  IMPRESSION: Nonspecific bowel gas pattern, with paucity of upper abdominal small bowel gas. No obstruction or free intraperitoneal air identified.  Bibasilar volume loss with patchy atelectasis.   Electronically Signed   By: Jeronimo Greaves M.D.    On: 06/16/2013 17:20    Microbiology: Recent Results (from the past 240 hour(s))  SURGICAL PCR SCREEN     Status: None   Collection Time    06/19/13  5:24 AM      Result Value Range Status   MRSA, PCR NEGATIVE  NEGATIVE Final   Staphylococcus aureus NEGATIVE  NEGATIVE Final   Comment:            The Xpert SA Assay (FDA     approved for NASAL specimens     in patients over 26 years of age),     is one component of     a comprehensive surveillance     program.  Test performance has     been validated by [employer] for patients greater     than or equal to 5 year old.     It is not intended     to diagnose infection nor to     guide or monitor treatment.     Labs: Basic Metabolic Panel:  Recent Labs Lab 06/17/13 0532 06/18/13 0537 06/19/13 0516 06/20/13 0541 06/21/13 0521  NA 138 135 136 136 136  K 3.9 3.8 3.9 4.2 3.9  CL 104 101 102 101 103  CO2 23 25 26 27 25   GLUCOSE 132* 112* 115* 123* 97  BUN 13 9 9 11 13   CREATININE 0.77 0.66 0.73 0.67 0.71  CALCIUM 8.6 8.4 8.7 9.2 8.6  MG  --   --   --  2.4  --    Liver Function Tests:  Recent Labs Lab 06/17/13 0532 06/18/13 0537 06/19/13 0516 06/20/13 0541 06/21/13 0521  AST 42* 24 15 53* 35  ALT 49 30 21 47 42  ALKPHOS 80 72 68 88 71  BILITOT 0.7 0.7 0.6 0.5 0.3  PROT 6.9 6.8 6.5 7.5 6.4  ALBUMIN 3.6 3.3* 3.0* 3.2* 2.9*    Recent Labs Lab 06/17/13 0930 06/18/13 0537 06/19/13 0516 06/20/13 0541 06/21/13 0521  LIPASE 1794* 701* 170* 99* 194*   No results found for this basename: AMMONIA,  in the last 168 hours CBC:  Recent Labs Lab 06/17/13 0532 06/18/13 0537 06/19/13 0516 06/20/13 0541 06/21/13 0521  WBC 11.6* 11.2* 10.9* 10.6* 9.9  NEUTROABS  --   --   --  8.9*  --   HGB 14.4 14.0 13.0 13.6 12.3*  HCT 42.3 40.2 38.0* 39.2 35.3*  MCV 88.3 87.6 88.0 87.9 88.0  PLT 223 222 215 269 263   Cardiac Enzymes: No results found for this basename: CKTOTAL, CKMB, CKMBINDEX, TROPONINI,  in the  last 168 hours BNP: BNP (last 3 results) No results found for this basename: PROBNP,  in the last 8760 hours CBG: No results found for this basename: GLUCAP,  in the last 168 hours     Signed:  Audery Wassenaar  [facility] 06/21/2013, 12:09 PM

## 2013-06-21 NOTE — Progress Notes (Signed)
Pt discharged to home. DC instructions given with wife at bedside. Prescription x 1 given for pain med. No concerns voiced.  Left unit in wheelchair pushed by nurse tech. Left ingood condition. Vwilliams,rn.

## 2013-06-22 ENCOUNTER — Encounter (HOSPITAL_COMMUNITY): Payer: Self-pay | Admitting: Surgery

## 2013-06-22 NOTE — ED Provider Notes (Signed)
Medical screening examination/treatment/procedure(s) were conducted as a shared visit with non-physician practitioner(s) and myself.  I personally evaluated the patient during the encounter.  EKG Interpretation    Date/Time:    Ventricular Rate:    PR Interval:    QRS Duration:   QT Interval:    QTC Calculation:   R Axis:     Text Interpretation:             Presents to me for evaluation of abdominal pain with nausea and vomiting. Patient reports a distant history of pancreatitis. Patient was medicated. Ultrasound was ordered to evaluate for the possibility of gallbladder disease, none seen. Patient admitted for management of acute pancreatitis.  Gilda Crease, MD 06/22/13 [phone]

## 2013-07-23 ENCOUNTER — Encounter (INDEPENDENT_AMBULATORY_CARE_PROVIDER_SITE_OTHER): Payer: Self-pay | Admitting: Surgery

## 2013-07-23 ENCOUNTER — Encounter (INDEPENDENT_AMBULATORY_CARE_PROVIDER_SITE_OTHER): Payer: Self-pay

## 2013-07-23 ENCOUNTER — Ambulatory Visit (INDEPENDENT_AMBULATORY_CARE_PROVIDER_SITE_OTHER): Payer: Self-pay | Admitting: Surgery

## 2013-07-23 VITALS — BP 122/80 | HR 72 | Temp 97.6°F | Resp 14 | Ht 67.0 in | Wt 195.8 lb

## 2013-07-23 DIAGNOSIS — Z9889 Other specified postprocedural states: Secondary | ICD-10-CM

## 2013-07-23 DIAGNOSIS — Z9049 Acquired absence of other specified parts of digestive tract: Secondary | ICD-10-CM | POA: Insufficient documentation

## 2013-07-23 NOTE — Patient Instructions (Signed)
May return to work now.

## 2013-07-23 NOTE — Progress Notes (Signed)
Brandon Hood 53 y.o.  Body mass index is 30.66 kg/(m^2).  Patient Active Problem List   Diagnosis Date Noted  . Elevated blood pressure 06/18/2013  . Pancreatitis 06/16/2013  . Gallstone pancreatitis 06/16/2013  . Abdominal pain, epigastric 06/16/2013  . Nausea with vomiting 06/16/2013  . High blood pressure 06/16/2013  . Acute pancreatitis 06/16/2013  . Cholelithiasis 06/16/2013  . Elevated LFTs 06/16/2013    No Known Allergies  Past Surgical History  Procedure Laterality Date  . No past surgeries    . Cholecystectomy N/A 06/19/2013    Procedure: LAPAROSCOPIC CHOLECYSTECTOMY WITH INTRAOPERATIVE CHOLANGIOGRAM;  Surgeon: Valarie MerinoMatthew B Akili Cuda, MD;  Location: [facility];  Service: General;  Laterality: N/A;   No primary provider on file. No diagnosis found.  Doing well after Lap Chole.  Incisions look good.  Able to get back to work.   Return prn.  Matt B. Daphine DeutscherMartin, MD, [facility] County[facility]ACS  [facility] [phone] beeper [phone]  07/23/2013 10:11 AM

## 2013-08-14 ENCOUNTER — Encounter (INDEPENDENT_AMBULATORY_CARE_PROVIDER_SITE_OTHER): Payer: Self-pay | Admitting: Surgery

## 2014-11-05 NOTE — H&P (Signed)
PATIENT NAME:  Brandon Hood, Brandon Hood MR#:  [id number] DATE OF BIRTH:  November 07, 1960  DATE OF ADMISSION:  11/13/2012  PRIMARY CARE PHYSICIAN: None.   CHIEF COMPLAINT:  Abdominal pain and vomiting.   HISTORY OF PRESENT ILLNESS: A 54 year old male patient with prior history of idiopathic acute pancreatitis in 2010, presents to the Emergency Room complaining of acute onset of abdominal pain and vomiting yesterday night. The patient has had multiple episodes of vomiting, severe epigastric abdominal pain, unable to keep anything down, here in the Emergency Room. The patient had a lipase of greater than 3000. Ultrasound showing just gallstones without any obstruction and liver function tests normal and is being admitted to the hospitalist service for acute pancreatitis. The patient still has severe pain, is n.p.o. Afebrile, does have a mildly elevated leukocytosis.   No shortness of breath. No aggravating or relieving factors. No radiation of the pain. Pain is 10/10, relieved by pain medications to about 6/10. Similar to his past episode.   PAST MEDICAL HISTORY:  Acute pancreatitis and 2010.   FAMILY HISTORY: No history of pancreatitis. His father and mother have no chronic medical problems.   SOCIAL HISTORY: The patient works as a Corporate investment banker. Smokes about 1 or 2 cigarettes a day, occasional alcohol on weekends, maybe 1 or 2 beers. No illicit drug use.   CODE STATUS: Full code.   ALLERGIES: No known drug allergies.   HOME MEDICATIONS: None.   REVIEW OF SYSTEMS:  CONSTITUTIONAL: Complains of some fatigue. No weight loss, weight gain.  EYES: No blurred vision, pain, redness.   EARS, NOSE, THROAT: No tinnitus, ear pain, hearing loss.  RESPIRATORY: No shortness of breath, wheezing, hemoptysis.  CARDIOVASCULAR: No chest pain, orthopnea, edema.  GASTROINTESTINAL: Has nausea, vomiting, abdominal pain. No diarrhea, melena, hematochezia or hematemesis.  GENITOURINARY: No hematuria, frequency,  dysuria.  SKIN: No rash, ulcers.  MUSCULOSKELETAL: No arthritis, back pain.  HEMATOLOGIC: No anemia, easy bruising, bleeding.  NEUROLOGIC: No focal numbness, weakness, seizures.  ENDOCRINE: No polyuria, polydipsia, thyroid problems.   PHYSICAL EXAMINATION: VITAL SIGNS: Temperature 98.1, pulse 69, blood pressure of 168/96, saturating 98% on room air.  GENERAL: Obese male patient lying in bed in distress secondary to his abdominal pain.  PSYCHIATRIC: Alert, oriented x 3. Mood and affect appropriate. Judgment intact.  HEENT: Atraumatic, normocephalic. Oral mucosa moist and pink. External ears and nose normal. No pallor or icterus. Pupils bilateral equal and reactive to light.  NECK: Supple. No thyromegaly. No palpable lymph nodes. Trachea midline. No carotid bruit, JVD.  CARDIOVASCULAR: S1, S2, without any murmurs. Peripheral pulses 2+.  RESPIRATORY: Normal work of breathing. Clear to auscultation on both sides.  GASTROINTESTINAL: Soft abdomen, nontender. Bowel sounds present. No hepatosplenomegaly palpable.  SKIN: Warm and dry. No petechiae, rash, ulcers.  MUSCULOSKELETAL: No joint swelling, redness, effusion of the large joints. Normal muscle tone.  NEUROLOGICAL: Motor strength 5/5 in upper and lower extremities. Sensation is intact all over. Cranial nerves II through XII are intact.  LYMPHATIC: No cervical lymphadenopathy.   LABORATORY, DIAGNOSTIC AND RADIOLOGIC DATA:  Show glucose 117, BUN 14, creatinine 0.80, sodium 140, potassium 3.8, chloride 107, lipase greater than 3000, albumin 3.8. AST, ALT, alkaline phosphatase all normal. Troponin less than 0.02. WBC 14.5, hemoglobin 14.3, platelets of 236, MCV 88.   EKG shows normal sinus rhythm rate of 64.   Ultrasound of the abdomen shows one gallstone. No obstruction. No cholecystitis. No pancreatitis.   ASSESSMENT AND PLAN: 1.  Acute idiopathic pancreatitis. The patient does  have gallstone in his gallbladder, but no acute cholecystitis or  blockage. The liver function is normal. We will manage conservatively with pain medications, IV fluids. The patient will be nothing by mouth except medications at this time. He can start liquid diet once he is able to tolerate. The patient does not drink much alcohol and does not take any medications, which could be implicated in pancreatitis. We will follow clinically if there is any worsening. The patient will have a CAT scan of the abdomen done.  2.  Elevated blood pressures without diagnosis of hypertension, likely secondary from the pain. If there is any sustained elevation blood pressure, we will start the patient on hypertensive medications.  3.  Dehydration secondary to vomiting. The patient will be on IV fluids.  4.  Leukocytosis likely from stress reaction from the pancreatitis, afebrile, no signs of infection at this time.  5.  Deep venous thrombosis prophylaxis with Lovenox.   TIME SPENT: Time spent today on this case was 43 minutes    ____________________________ Molinda BailiffSrikar R. Giannie Soliday, MD srs:cc D: 11/13/2012 15:45:33 ET T: 11/13/2012 16:00:30 ET JOB#: [id number]  cc: Wardell HeathSrikar R. Elpidio AnisSudini, MD, <Dictator> Orie FishermanSRIKAR R Dandrea Medders MD ELECTRONICALLY SIGNED 11/26/2012 13:56

## 2014-11-05 NOTE — Discharge Summary (Signed)
PATIENT NAME:  Brandon Hood, Brandon Hood MR#:  [id number] DATE OF BIRTH:  1960/12/15  DATE OF ADMISSION:  11/13/2012  DATE OF DISCHARGE:  11/14/2012  PRIMARY CARE PHYSICIAN:  None.   DISCHARGE DIAGNOSES: 1.  Acute pancreatitis.  2.  Asymptomatic gallstones.  3.  Elevated blood pressure without diagnosis of hypertension.  4.  Dehydration.  5.  Tobacco abuse.   IMAGING STUDIES DONE:  Included an ultrasound of the abdomen, which showed gallstone with no cholecystitis, normal caliber common biliary duct.  CT scan of the abdomen showed acute pancreatitis with peripancreatic edema but no pseudocyst or necrosis.   ADMITTING HISTORY AND PHYSICAL AND HOSPITAL COURSE: Please see detailed H and P dictated on 11/13/2012. In brief, a 54 year old male patient with prior history of pancreatitis, who presented to the Emergency Room complaining of abdominal pain, vomiting, and was found to have acute pancreatitis, and was admitted to the hospitalist service.   HOSPITAL COURSE:  Acute pancreatitis. This was idiopathic in origin, although the patient had 1 gallstone in the gallbladder with no cholecystitis, no obstruction. Liver function tests were normal. Lipase was greater than 3000. On the day of discharge, the patient's abdominal pain had resolved. No nausea or vomiting, has tolerated food well. CT scan of the abdomen showed no necrosis or pseudocyst, and is being discharged home in a fair condition. The patient did have elevated blood pressure without diagnosis of hypertension during the hospital stay. This was secondary to his pain. The patient was counseled to quit tobacco for greater than 3 minutes at the time of admission.   DISCHARGE MEDICATIONS:  Include Zofran 4 mg oral 3 times a day as needed for nausea and vomiting.  2.  Acetaminophen/oxycodone 325/5, 1 tablet oral 3 times a day as needed for pain.  3.  Acetaminophen 650 mg oral q.4 hours as needed for pain and temperature.   DISCHARGE INSTRUCTIONS:   The patient was discharged home on a low-fat diet and activity as tolerated. Follow up with his primary care physician in a week. The patient has been requested to return to the Emergency Room if his symptoms worsen in any way or has fever.   Time spent on day of discharge in discharge activity was 46 minutes.   ____________________________ Molinda BailiffSrikar R. Conleigh Heinlein, MD srs:dmm D: 11/14/2012 14:26:27 ET T: 11/14/2012 22:53:23 ET JOB#: [id number]  cc: Wardell HeathSrikar R. Elpidio AnisSudini, MD, <Dictator> Orie FishermanSRIKAR R Marizol Borror MD ELECTRONICALLY SIGNED 11/26/2012 13:56

## 2015-03-05 IMAGING — CT CT ABD-PELV W/ CM
2 of 5 series · 17 of 46 positions shown, 19 images · IV contrast (OMNIPAQUE)
Comparison: None

CLINICAL DATA: Pancreatitis, mid abdominal pain

EXAM:
CT ABDOMEN AND PELVIS WITH CONTRAST
TECHNIQUE: Multidetector CT imaging of the abdomen and pelvis was performed
using the standard protocol following bolus administration of
intravenous contrast. Sagittal and coronal MPR images reconstructed
from axial data set.
CONTRAST:  100mL OMNIPAQUE IOHEXOL 300 MG/ML SOLN. Dilute oral
contrast.

[Series 2: rtn a/p with · axial · 0.87mm/px · z∈[+932,+1392]mm · 14 of 104 slices shown, 16 images]
[im 6/104  soft-tissue]
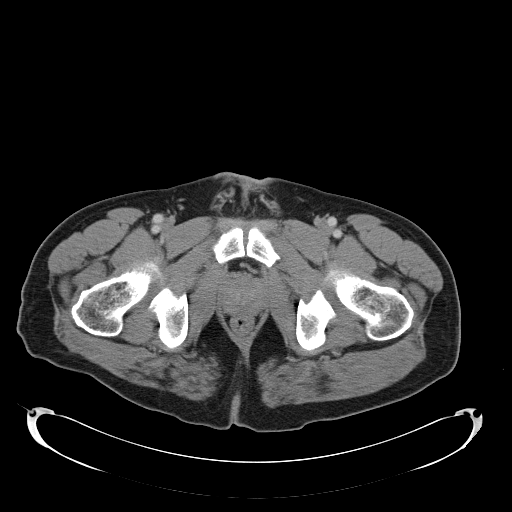
[im 6/104  bone]
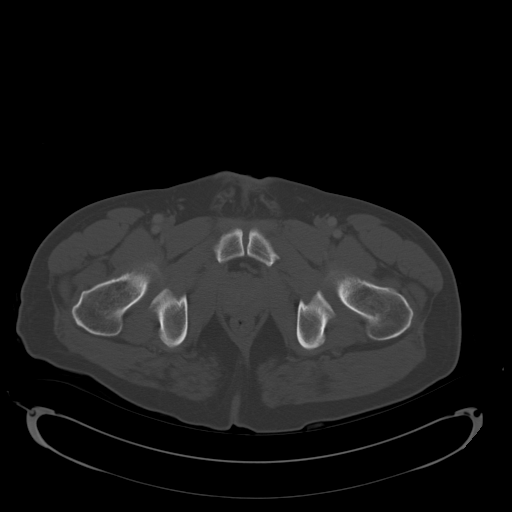
[im 11/104  soft-tissue]
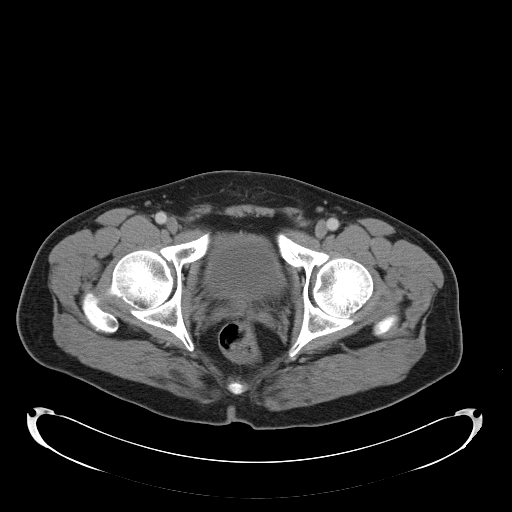
[im 22/104  soft-tissue]
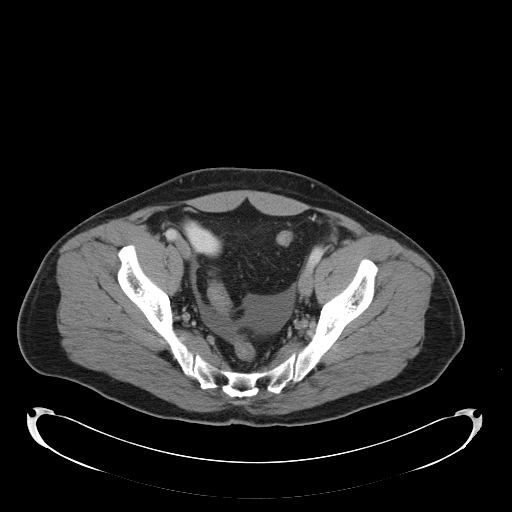
[im 28/104  soft-tissue]
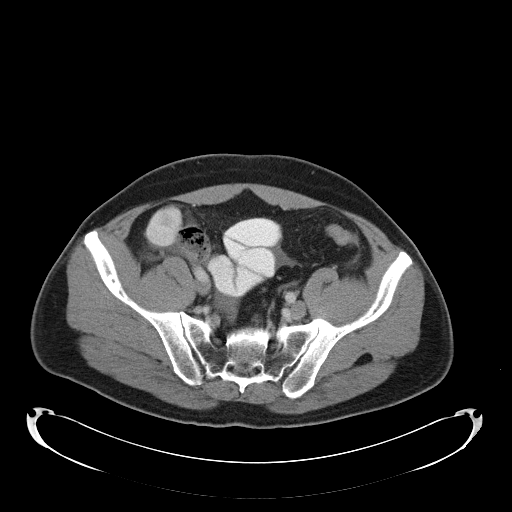
[im 33/104  soft-tissue]
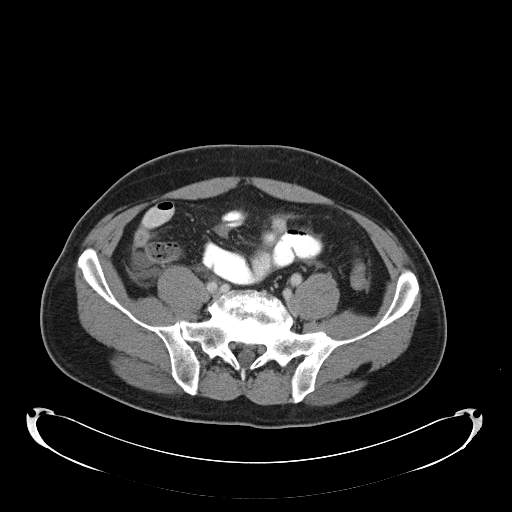
[im 44/104  soft-tissue]
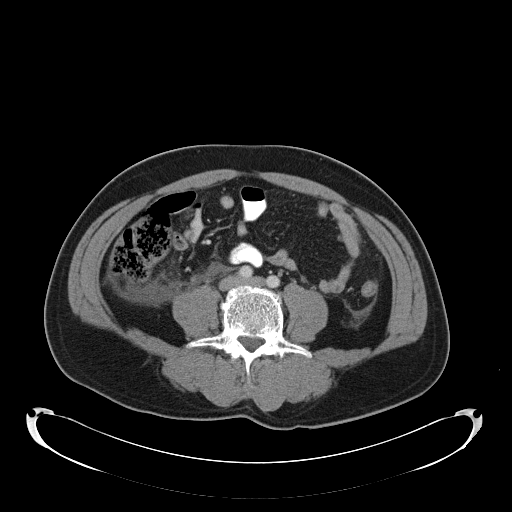
[im 49/104  soft-tissue]
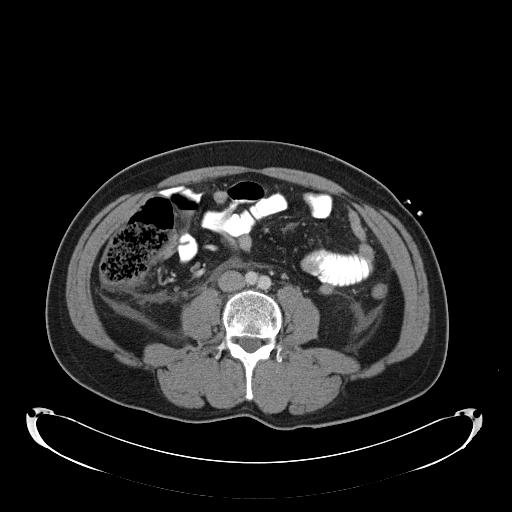
[im 55/104  soft-tissue]
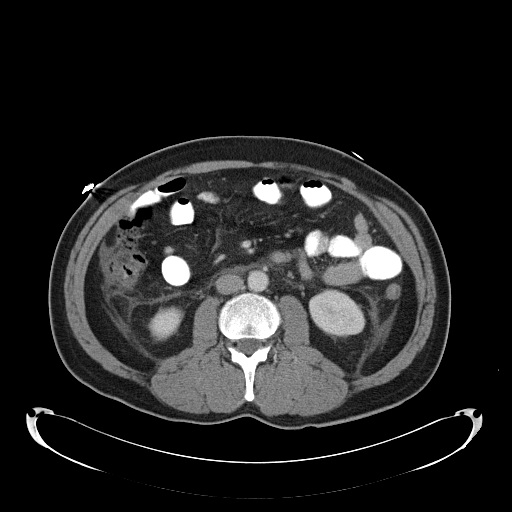
[im 60/104  soft-tissue]
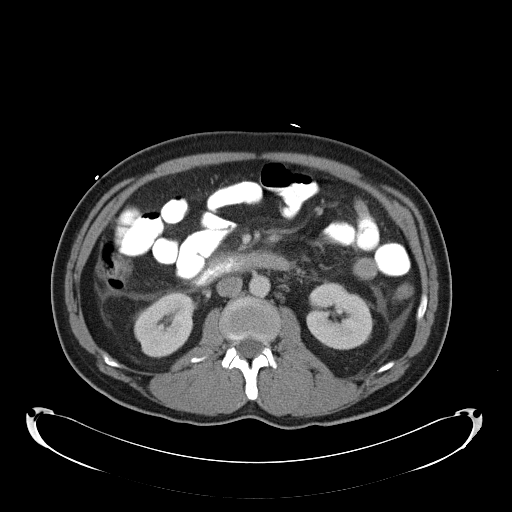
[im 60/104  bone]
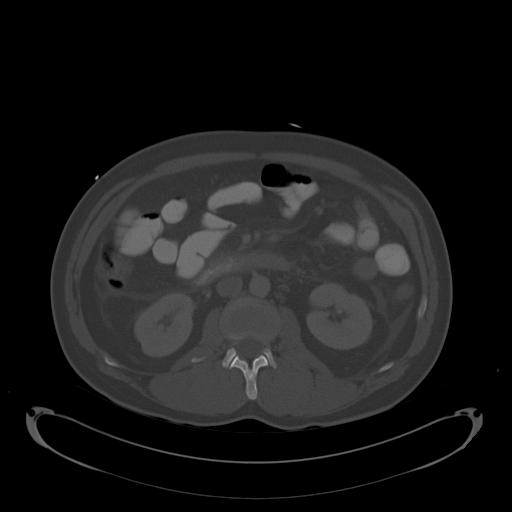
[im 71/104  soft-tissue]
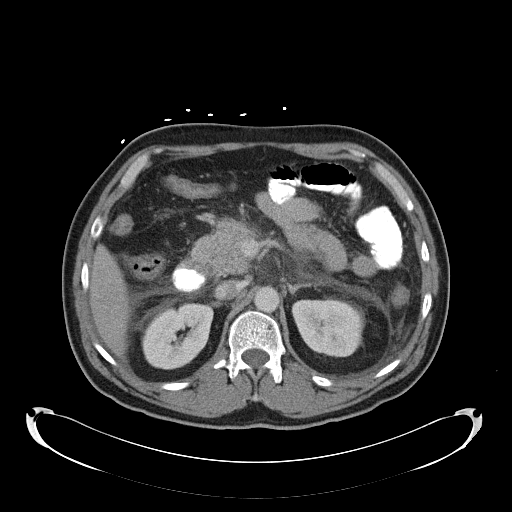
[im 76/104  soft-tissue]
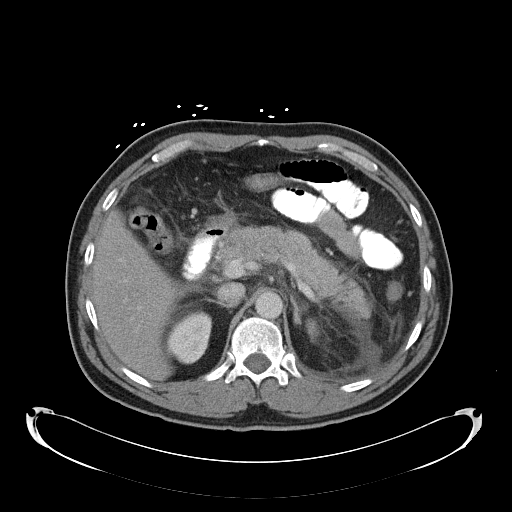
[im 82/104  soft-tissue]
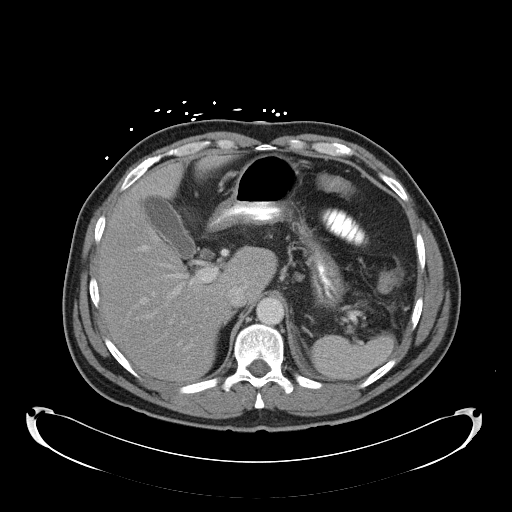
[im 93/104  soft-tissue]
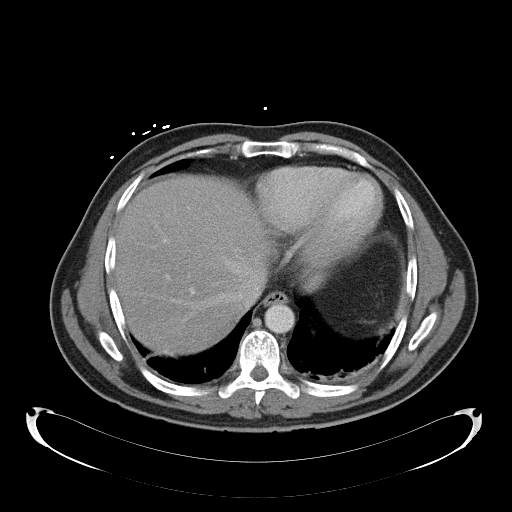
[im 98/104  soft-tissue]
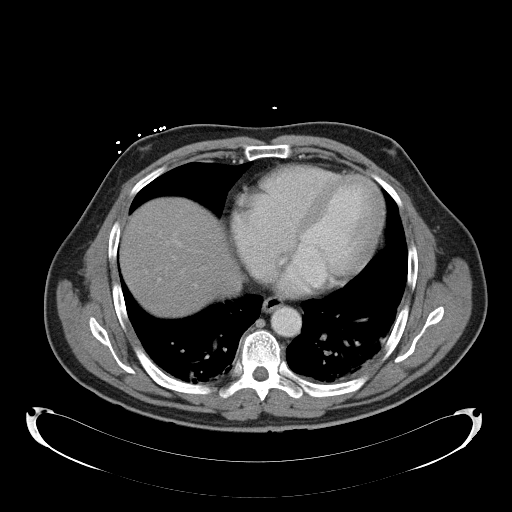

[Series 602: cor · coronal · 1.02mm/px · 3 of 128 slices shown]
[im 43/128  soft-tissue]
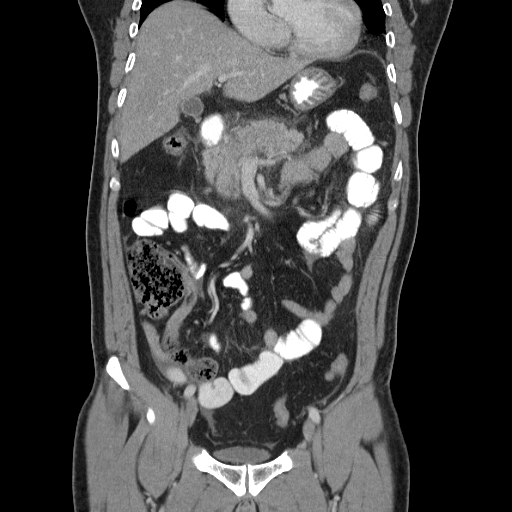
[im 57/128  soft-tissue]
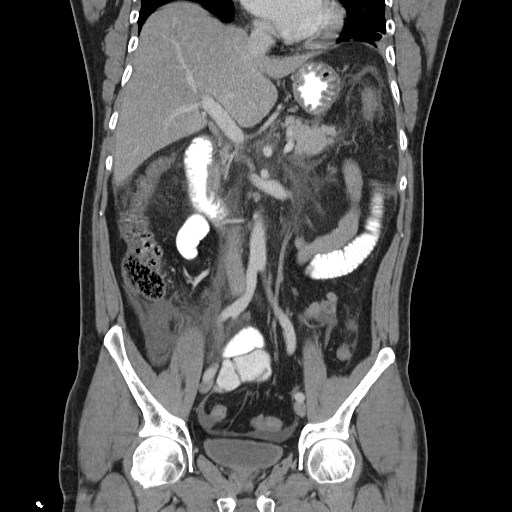
[im 71/128  soft-tissue]
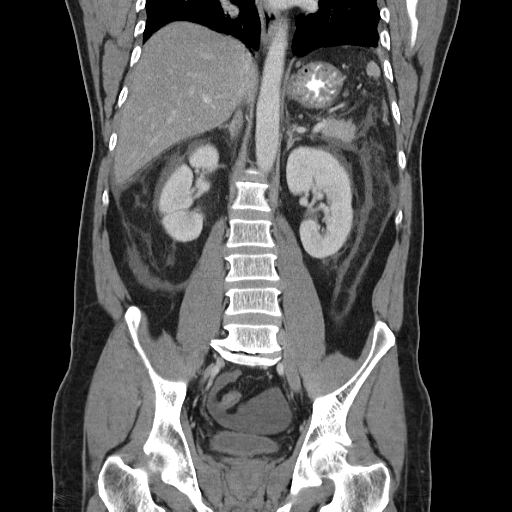

[17 of 46 positions shown; findings below may reference images not displayed]

FINDINGS: Dependent atelectasis at both lung bases.

Enlarged edematous pancreas with peripancreatic infiltrative changes
consistent with acute pancreatitis.

Fluid identified along Gerota's fascia to the lateral conal fascia
bilaterally greater on left.

No evidence of pancreatic pseudocyst, hemorrhage or necrosis.

No ductal dilatation or calcifications seen.

Vascular structures appear patent.

Cyst within lateral segment left lobe liver 2.8 x 3.2 cm image 18.

Liver, spleen, kidneys, and adrenal glands normal appearance.

Normal appendix.

Low-attenuation free pelvic fluid.

Stomach and bowel loops normal appearance.

Unremarkable bladder in ureters.

No mass, adenopathy, free air, or acute osseous findings.
IMPRESSION: Acute uncomplicated pancreatitis.

Nonspecific free pelvic fluid.

Hepatic cyst.

## 2018-10-07 ENCOUNTER — Encounter: Payer: Self-pay | Admitting: Emergency Medicine

## 2018-10-07 ENCOUNTER — Emergency Department
Admission: EM | Admit: 2018-10-07 | Discharge: 2018-10-07 | Disposition: A | Discharge status: Home / Self Care | Payer: Self-pay | Attending: Emergency Medicine | Admitting: Emergency Medicine

## 2018-10-07 ENCOUNTER — Other Ambulatory Visit: Payer: Self-pay

## 2018-10-07 DIAGNOSIS — F17210 Nicotine dependence, cigarettes, uncomplicated: Secondary | ICD-10-CM | POA: Insufficient documentation

## 2018-10-07 DIAGNOSIS — I10 Essential (primary) hypertension: Secondary | ICD-10-CM | POA: Insufficient documentation

## 2018-10-07 DIAGNOSIS — J029 Acute pharyngitis, unspecified: Secondary | ICD-10-CM | POA: Insufficient documentation

## 2018-10-07 NOTE — ED Provider Notes (Addendum)
[facility] Emergency Department Provider Note  ____________________________________________   I have reviewed the triage vital signs and the nursing notes. Where available I have reviewed prior notes and, if possible and indicated, outside hospital notes.    HISTORY  Chief Complaint Sore Throat    HPI Brandon Hood is a 58 y.o. male  With a history of hypertension in the past although he does not regularly take medications work, he states his blood pressures up every time he is around the doctor.  Patient noticed he had a slight sore throat this afternoon, hurts to swallow a little bit no difficulty speaking no difficulty swallowing solids or liquids, no swelling of his tongue no sensation of close throat, just a mild sore throat for the last 2 hours or so.  However, given all the scare about the coronavirus he became anxious and came in to be evaluated. He would like to be tested however testing is not available.  Does not have any chest pain shortness of breath nausea or vomiting he has no exertional symptoms, the pain is minimal unless he swallows and that it hurts.  No vomiting no abdominal pain    Past Medical History:  Diagnosis Date  . Medical history non-contributory     Patient Active Problem List   Diagnosis Date Noted  . S/P laparoscopic cholecystectomy 07/23/2013  . Elevated blood pressure 06/18/2013  . Pancreatitis 06/16/2013  . Gallstone pancreatitis 06/16/2013  . Abdominal pain, epigastric 06/16/2013  . Nausea with vomiting 06/16/2013  . High blood pressure 06/16/2013  . Acute pancreatitis 06/16/2013  . Cholelithiasis 06/16/2013  . Elevated LFTs 06/16/2013    Past Surgical History:  Procedure Laterality Date  . CHOLECYSTECTOMY N/A 06/19/2013   Procedure: LAPAROSCOPIC CHOLECYSTECTOMY WITH INTRAOPERATIVE CHOLANGIOGRAM;  Surgeon: Valarie Merino, MD;  Location: [facility];  Service: General;  Laterality: N/A;  . NO PAST SURGERIES       Prior to Admission medications   Not on File    Allergies Patient has no known allergies.  No family history on file.  Social History Social History   Tobacco Use  . Smoking status: Current Some Day Smoker  . Smokeless tobacco: Never Used  Substance Use Topics  . Alcohol use: No  . Drug use: No    Review of Systems Constitutional: No fever/chills Eyes: No visual changes. ENT: + sore throat. No stiff neck no neck pain Cardiovascular: Denies chest pain. Respiratory: Denies shortness of breath. Gastrointestinal:   no vomiting.  No diarrhea.  No constipation. Genitourinary: Negative for dysuria. Musculoskeletal: Negative lower extremity swelling Skin: Negative for rash. Neurological: Negative for severe headaches, focal weakness or numbness.   ____________________________________________   PHYSICAL EXAM:  VITAL SIGNS: ED Triage Vitals  Enc Vitals Group     BP 10/07/18 1943 (!) 198/100     Pulse Rate 10/07/18 1943 73     Resp 10/07/18 1943 18     Temp 10/07/18 1943 98.1 F (36.7 C)     Temp Source 10/07/18 1943 Oral     SpO2 10/07/18 1943 99 %     Weight 10/07/18 1943 200 lb (90.7 kg)     Height 10/07/18 1943 5' 1.81" (1.57 m)     Head Circumference --      Peak Flow --      Pain Score 10/07/18 1946 4     Pain Loc --      Pain Edu? --      Excl. in [facility]--  Constitutional: Alert and oriented. Well appearing and in no acute distress. Eyes: Conjunctivae are normal Head: Atraumatic HEENT: No congestion/rhinnorhea. Mucous membranes are moist.  Oropharynx non-erythematous no swelling no uvular swelling no stridor, normal exam Neck:   Nontender with no meningismus, no masses, no stridor Cardiovascular: Normal rate, regular rhythm. Grossly normal heart sounds.  Good peripheral circulation. Respiratory: Normal respiratory effort.  No retractions. Lungs CTAB. Abdominal: Soft and nontender. No distention. No guarding no rebound Back:  There is no focal  tenderness or step off.  there is no midline tenderness there are no lesions noted. there is no CVA tenderness  musculoskeletal: No lower extremity tenderness, no upper extremity tenderness. No joint effusions, no DVT signs strong distal pulses no edema Neurologic:  Normal speech and language. No gross focal neurologic deficits are appreciated.  Skin:  Skin is warm, dry and intact. No rash noted. Psychiatric: Mood and affect are anxious. Speech and behavior are normal.  ____________________________________________   LABS (all labs ordered are listed, but only abnormal results are displayed)  Labs Reviewed - No data to display  Pertinent labs  results that were available during my care of the patient were reviewed by me and considered in my medical decision making (see chart for details). ____________________________________________  EKG  I personally interpreted any EKGs ordered by me or triage Normal sinus rhythm, rate 69 no acute ST elevation or depression there is Q waves inferiorly which new from EKG done 6 years ago. ____________________________________________  RADIOLOGY  Pertinent labs & imaging results that were available during my care of the patient were reviewed by me and considered in my medical decision making (see chart for details). If possible, patient and/or family made aware of any abnormal findings.  No results found. ____________________________________________    PROCEDURES  Procedure(s) performed: None  Procedures  Critical Care performed: None  ____________________________________________   INITIAL IMPRESSION / ASSESSMENT AND PLAN / ED COURSE  Pertinent labs & imaging results that were available during my care of the patient were reviewed by me and considered in my medical decision making (see chart for details).  Here with a sore throat hurts to swallow low suspicion for ACS PE or dissection, he does not really meet criteria by [employer] criteria  for swallowing.  He has no fever no anterior lymphadenopathy, no exudate etc.  He looks quite well.  He is very very worried about the coronavirus.  Is hard to know for mild sore throat for couple hours is the coronavirus and we have no testing anyway.  We have tried to reassure him.  Because of his elevated blood pressure, which is probably secondary to chronic blood pressure elevation and exacerbated by his clear anxiety, about the coronavirus.  We will obtain EKG although he has no symptoms that I think normally would be associated with ACS.  If that is reassuring I do think cardiac enzymes are indicated.  Patient is worried about the coronavirus and I really think he meets criteria for quarantine given a slight sore throat for couple hours and certainly would be a large disruption to his life and without fever or other symptoms or known exposure with unknown degree of community transthoracic at this time, I do feel that he is likely not necessarily indicated to do so but I have advised to follow CDC recommendations in this regard he states he will recommend.  I have also advised that he get his blood pressure rechecked in the next couple days to make sure it  is trending down.  This is not I do not think represents hypertensive urgency.  Last time he was admitted to the hospital when he first came in his blood pressure was quite high as well, and his last visit to his last doctor he was 158/94, again suggestive either of whitecoat syndrome or chronic undiagnosed hypertension.Marland Kitchen  He likely should be on antihypertensive medications but I do not want to start him out of the emergency room pursuant to [facility] of emergency physician guidelines.  Blood pressure already down to the 170s.  ----------------------------------------- 8:06 PM on 10/07/2018 -----------------------------------------  Patient's EKG does show flipped T waves inferiorly of unknown duration, this is likely not acute ischemia.  He is  not having any symptoms of ACS.  I have offered him cardiac enzymes but he refuses.  He wants to make sure he does not have coronavirus and as he has no testing he would like to leave.  He states he saw on TV about the coronavirus and is very concerned about it.  Very self-assured in the opinion that this is not his heart.  States he has a slight sore throat.  He states he knows the difference between a sore throat and referred cardiac pain because it hurts when he swallows I have given him instructions about how to handle this at home, and if he has any chest pain shortness of breath or change his mind he knows he is supposed to return.      ____________________________________________   FINAL CLINICAL IMPRESSION(S) / ED DIAGNOSES  Final diagnoses:  None      This chart was dictated using voice recognition software.  Despite best efforts to proofread,  errors can occur which can change meaning.      Jeanmarie Plant, MD 10/07/18 Babette Relic    Jeanmarie Plant, MD 10/07/18 2004    Jeanmarie Plant, MD 10/07/18 2009

## 2018-10-07 NOTE — Discharge Instructions (Signed)
Blood pressure was somewhat elevated, please return to the emergency room if you your mind about further work-up or have any chest pain shortness of breath we feel worse in any way including high fevers or difficulty swallowing or speaking.  Please follow closely with your primary care doctor or the one listed above for repeat check of your blood pressure in the next couple days.  Please follow the CDC recommendations for quarantine as [email or web address], if you develop fever high cough or persistent diarrhea or other concerns please consider staying away from other people as the CDC recommends.

## 2018-10-07 NOTE — ED Triage Notes (Signed)
Patient ambulatory to triage with steady gait, without difficulty or distress noted, mask in place; pt reports since yesterday having sore throat with no accomp symptoms
